# Patient Record
Sex: Female | Born: 1945
Health system: Southern US, Community
[De-identification: ages and names within clinical notes are randomized; demographics above are authoritative.]

## PROBLEM LIST (undated history)

## (undated) DIAGNOSIS — M79602 Pain in left arm: Secondary | ICD-10-CM

## (undated) DIAGNOSIS — H2511 Age-related nuclear cataract, right eye: Secondary | ICD-10-CM

## (undated) DIAGNOSIS — H2512 Age-related nuclear cataract, left eye: Secondary | ICD-10-CM

## (undated) DIAGNOSIS — E669 Obesity, unspecified: Secondary | ICD-10-CM

## (undated) DIAGNOSIS — K219 Gastro-esophageal reflux disease without esophagitis: Secondary | ICD-10-CM

## (undated) DIAGNOSIS — E7800 Pure hypercholesterolemia, unspecified: Secondary | ICD-10-CM

## (undated) DIAGNOSIS — I10 Essential (primary) hypertension: Secondary | ICD-10-CM

## (undated) DIAGNOSIS — H409 Unspecified glaucoma: Secondary | ICD-10-CM

## (undated) HISTORY — DX: Obesity, unspecified: E66.9

## (undated) HISTORY — DX: Unspecified glaucoma: H40.9

## (undated) HISTORY — PX: BREAST EXCISIONAL BIOPSY: SUR124

## (undated) HISTORY — PX: EYE SURGERY: SHX253

## (undated) HISTORY — PX: TONSILLECTOMY: SUR1361

## (undated) HISTORY — PX: ABDOMINAL HYSTERECTOMY: SHX81

---

## 1998-01-14 ENCOUNTER — Ambulatory Visit (HOSPITAL_COMMUNITY): Admission: RE | Admit: 1998-01-14 | Discharge: 1998-01-14 | Payer: Self-pay | Admitting: Endocrinology

## 1998-01-14 ENCOUNTER — Encounter: Payer: Self-pay | Admitting: Endocrinology

## 1998-02-13 ENCOUNTER — Ambulatory Visit (HOSPITAL_COMMUNITY): Admission: RE | Admit: 1998-02-13 | Discharge: 1998-02-13 | Payer: Self-pay | Admitting: Obstetrics and Gynecology

## 1998-02-13 ENCOUNTER — Encounter: Payer: Self-pay | Admitting: Obstetrics and Gynecology

## 1998-02-17 ENCOUNTER — Encounter: Payer: Self-pay | Admitting: Obstetrics and Gynecology

## 1998-02-17 ENCOUNTER — Ambulatory Visit (HOSPITAL_COMMUNITY): Admission: RE | Admit: 1998-02-17 | Discharge: 1998-02-17 | Payer: Self-pay | Admitting: Obstetrics and Gynecology

## 1999-01-13 ENCOUNTER — Encounter: Payer: Self-pay | Admitting: Endocrinology

## 1999-01-13 ENCOUNTER — Encounter: Payer: Self-pay | Admitting: Obstetrics and Gynecology

## 1999-01-13 ENCOUNTER — Ambulatory Visit (HOSPITAL_COMMUNITY): Admission: RE | Admit: 1999-01-13 | Discharge: 1999-01-13 | Payer: Self-pay | Admitting: Endocrinology

## 1999-01-13 ENCOUNTER — Ambulatory Visit (HOSPITAL_COMMUNITY): Admission: RE | Admit: 1999-01-13 | Discharge: 1999-01-13 | Payer: Self-pay | Admitting: Obstetrics and Gynecology

## 1999-02-15 ENCOUNTER — Encounter: Payer: Self-pay | Admitting: Obstetrics and Gynecology

## 1999-02-16 ENCOUNTER — Observation Stay (HOSPITAL_COMMUNITY): Admission: AD | Admit: 1999-02-16 | Discharge: 1999-02-17 | Payer: Self-pay | Admitting: Obstetrics and Gynecology

## 1999-02-16 ENCOUNTER — Encounter (INDEPENDENT_AMBULATORY_CARE_PROVIDER_SITE_OTHER): Payer: Self-pay | Admitting: Specialist

## 1999-02-17 ENCOUNTER — Encounter: Payer: Self-pay | Admitting: Obstetrics and Gynecology

## 1999-02-19 ENCOUNTER — Encounter: Payer: Self-pay | Admitting: Obstetrics and Gynecology

## 1999-02-19 ENCOUNTER — Emergency Department (HOSPITAL_COMMUNITY): Admission: EM | Admit: 1999-02-19 | Discharge: 1999-02-20 | Payer: Self-pay

## 2000-01-03 ENCOUNTER — Other Ambulatory Visit: Admission: RE | Admit: 2000-01-03 | Discharge: 2000-01-03 | Payer: Self-pay | Admitting: Obstetrics and Gynecology

## 2000-02-29 ENCOUNTER — Encounter: Payer: Self-pay | Admitting: Endocrinology

## 2000-02-29 ENCOUNTER — Ambulatory Visit (HOSPITAL_COMMUNITY): Admission: RE | Admit: 2000-02-29 | Discharge: 2000-02-29 | Payer: Self-pay | Admitting: Endocrinology

## 2000-10-07 ENCOUNTER — Emergency Department (HOSPITAL_COMMUNITY): Admission: EM | Admit: 2000-10-07 | Discharge: 2000-10-07 | Payer: Self-pay | Admitting: Emergency Medicine

## 2000-11-06 ENCOUNTER — Ambulatory Visit (HOSPITAL_COMMUNITY): Admission: RE | Admit: 2000-11-06 | Discharge: 2000-11-06 | Payer: Self-pay | Admitting: Endocrinology

## 2000-11-06 ENCOUNTER — Encounter: Payer: Self-pay | Admitting: Endocrinology

## 2001-03-07 ENCOUNTER — Other Ambulatory Visit: Admission: RE | Admit: 2001-03-07 | Discharge: 2001-03-07 | Payer: Self-pay | Admitting: Obstetrics and Gynecology

## 2001-04-10 ENCOUNTER — Ambulatory Visit (HOSPITAL_COMMUNITY): Admission: RE | Admit: 2001-04-10 | Discharge: 2001-04-10 | Payer: Self-pay | Admitting: Endocrinology

## 2001-04-10 ENCOUNTER — Encounter: Payer: Self-pay | Admitting: Endocrinology

## 2002-04-12 ENCOUNTER — Ambulatory Visit (HOSPITAL_COMMUNITY): Admission: RE | Admit: 2002-04-12 | Discharge: 2002-04-12 | Payer: Self-pay | Admitting: Obstetrics and Gynecology

## 2002-04-12 ENCOUNTER — Encounter: Payer: Self-pay | Admitting: Obstetrics and Gynecology

## 2002-05-14 ENCOUNTER — Other Ambulatory Visit: Admission: RE | Admit: 2002-05-14 | Discharge: 2002-05-14 | Payer: Self-pay | Admitting: Obstetrics and Gynecology

## 2002-08-06 ENCOUNTER — Encounter: Admission: RE | Admit: 2002-08-06 | Discharge: 2002-11-04 | Payer: Self-pay | Admitting: Endocrinology

## 2003-04-21 ENCOUNTER — Ambulatory Visit (HOSPITAL_COMMUNITY): Admission: RE | Admit: 2003-04-21 | Discharge: 2003-04-21 | Payer: Self-pay | Admitting: Obstetrics and Gynecology

## 2004-08-04 ENCOUNTER — Ambulatory Visit (HOSPITAL_COMMUNITY): Admission: RE | Admit: 2004-08-04 | Discharge: 2004-08-04 | Payer: Self-pay | Admitting: Endocrinology

## 2005-09-21 ENCOUNTER — Ambulatory Visit (HOSPITAL_COMMUNITY): Admission: RE | Admit: 2005-09-21 | Discharge: 2005-09-21 | Payer: Self-pay | Admitting: Endocrinology

## 2006-05-11 ENCOUNTER — Encounter (INDEPENDENT_AMBULATORY_CARE_PROVIDER_SITE_OTHER): Payer: Self-pay | Admitting: Specialist

## 2006-05-11 ENCOUNTER — Ambulatory Visit (HOSPITAL_COMMUNITY): Admission: RE | Admit: 2006-05-11 | Discharge: 2006-05-11 | Payer: Self-pay | Admitting: *Deleted

## 2006-05-11 ENCOUNTER — Encounter (INDEPENDENT_AMBULATORY_CARE_PROVIDER_SITE_OTHER): Payer: Self-pay | Admitting: *Deleted

## 2006-10-10 ENCOUNTER — Ambulatory Visit (HOSPITAL_COMMUNITY): Admission: RE | Admit: 2006-10-10 | Discharge: 2006-10-10 | Payer: Self-pay | Admitting: Endocrinology

## 2007-07-29 ENCOUNTER — Emergency Department (HOSPITAL_BASED_OUTPATIENT_CLINIC_OR_DEPARTMENT_OTHER): Admission: EM | Admit: 2007-07-29 | Discharge: 2007-07-29 | Payer: Self-pay | Admitting: Emergency Medicine

## 2007-10-16 ENCOUNTER — Ambulatory Visit (HOSPITAL_COMMUNITY): Admission: RE | Admit: 2007-10-16 | Discharge: 2007-10-16 | Payer: Self-pay | Admitting: Endocrinology

## 2008-05-08 ENCOUNTER — Ambulatory Visit (HOSPITAL_COMMUNITY): Admission: EM | Admit: 2008-05-08 | Discharge: 2008-05-09 | Payer: Self-pay | Admitting: Internal Medicine

## 2008-05-08 ENCOUNTER — Ambulatory Visit: Payer: Self-pay | Admitting: Cardiology

## 2008-05-08 ENCOUNTER — Ambulatory Visit: Payer: Self-pay | Admitting: Diagnostic Radiology

## 2008-05-08 ENCOUNTER — Encounter: Payer: Self-pay | Admitting: Emergency Medicine

## 2008-05-19 ENCOUNTER — Telehealth (INDEPENDENT_AMBULATORY_CARE_PROVIDER_SITE_OTHER): Payer: Self-pay | Admitting: *Deleted

## 2008-05-20 ENCOUNTER — Encounter: Payer: Self-pay | Admitting: Cardiology

## 2008-05-20 ENCOUNTER — Ambulatory Visit: Payer: Self-pay

## 2008-05-26 DIAGNOSIS — E118 Type 2 diabetes mellitus with unspecified complications: Secondary | ICD-10-CM | POA: Insufficient documentation

## 2008-05-26 DIAGNOSIS — E663 Overweight: Secondary | ICD-10-CM | POA: Insufficient documentation

## 2008-05-26 DIAGNOSIS — E119 Type 2 diabetes mellitus without complications: Secondary | ICD-10-CM | POA: Insufficient documentation

## 2008-05-26 DIAGNOSIS — E7800 Pure hypercholesterolemia, unspecified: Secondary | ICD-10-CM | POA: Insufficient documentation

## 2008-05-26 DIAGNOSIS — M1990 Unspecified osteoarthritis, unspecified site: Secondary | ICD-10-CM | POA: Insufficient documentation

## 2008-05-26 DIAGNOSIS — I10 Essential (primary) hypertension: Secondary | ICD-10-CM | POA: Insufficient documentation

## 2008-06-16 ENCOUNTER — Telehealth: Payer: Self-pay | Admitting: Cardiology

## 2008-11-12 ENCOUNTER — Ambulatory Visit (HOSPITAL_COMMUNITY): Admission: RE | Admit: 2008-11-12 | Discharge: 2008-11-12 | Payer: Self-pay | Admitting: Endocrinology

## 2008-12-16 ENCOUNTER — Ambulatory Visit: Payer: Self-pay | Admitting: Gastroenterology

## 2009-07-04 IMAGING — CT CT ABDOMEN W/O CM
2 of 3 series · 13 of 36 positions shown, 18 images · non-contrast
Comparison: No comparison exams available.

CT ABDOMEN

CLINICAL DATA: Right flank pain radiating to right lower quadrant
with nausea.  Status post total hysterectomy, cholecystectomy and
appendectomy.

CT ABDOMEN AND PELVIS WITHOUT CONTRAST
TECHNIQUE: Multidetector CT imaging of the abdomen and pelvis was
performed following the standard
protocol without intravenous contrast.

[Series 2: renal stone > 200 lbs 5.0 b31f · axial · 0.76mm/px · z∈[+628,+1044]mm · 12 of 95 slices shown, 16 images]
[im 8/95  soft-tissue]
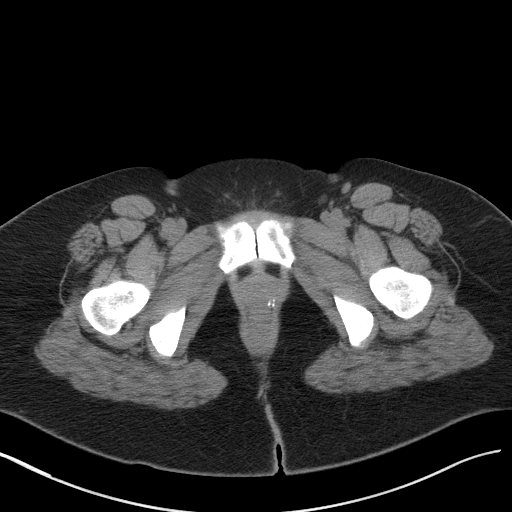
[im 8/95  bone]
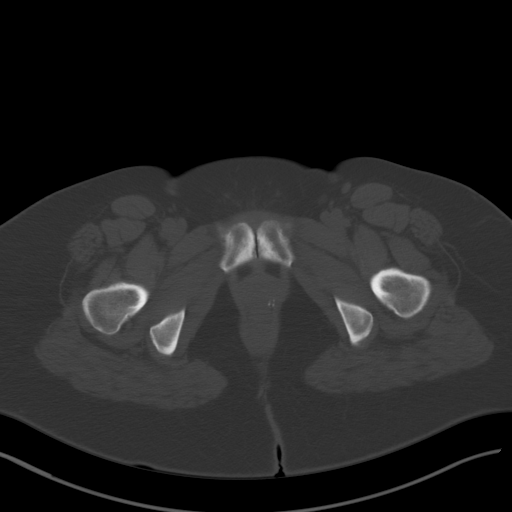
[im 16/95  soft-tissue]
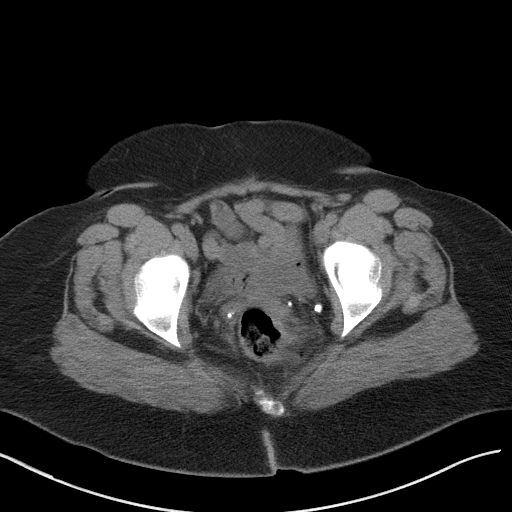
[im 24/95  soft-tissue]
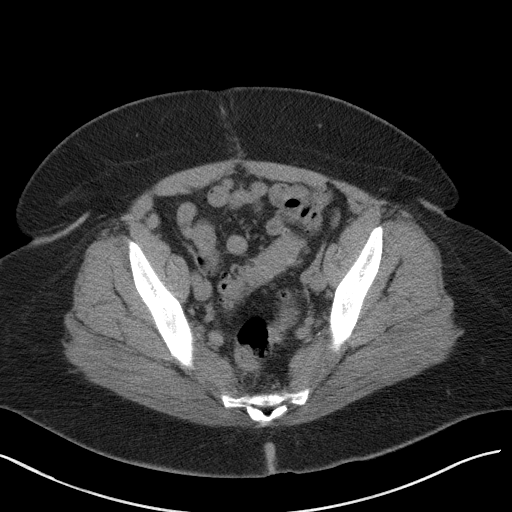
[im 36/95  soft-tissue]
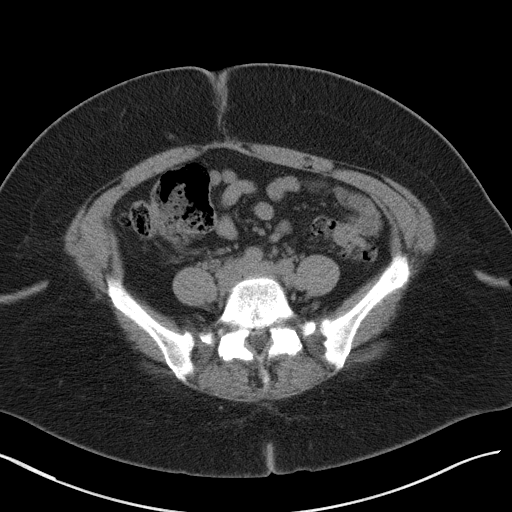
[im 44/95  soft-tissue]
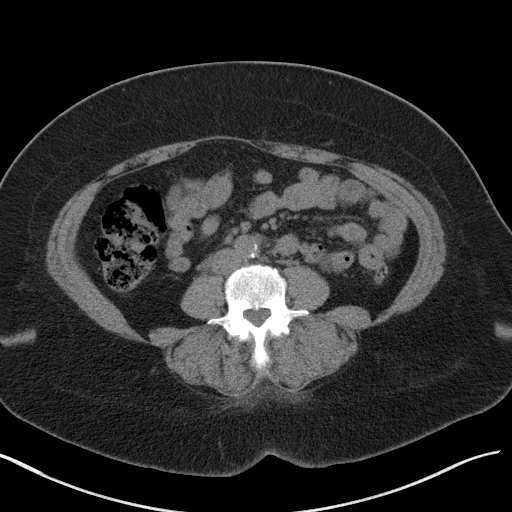
[im 51/95  soft-tissue]
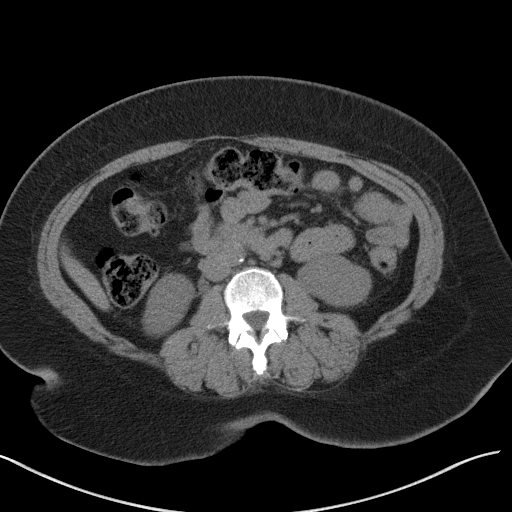
[im 59/95  soft-tissue]
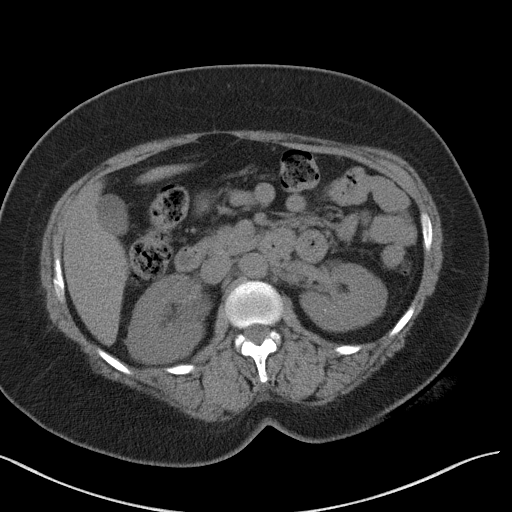
[im 71/95  soft-tissue]
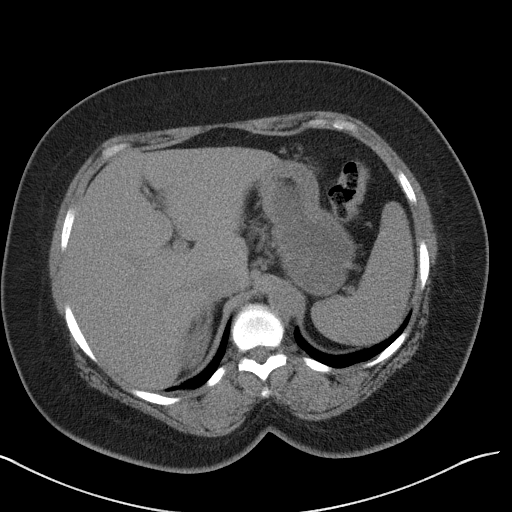
[im 79/95  soft-tissue]
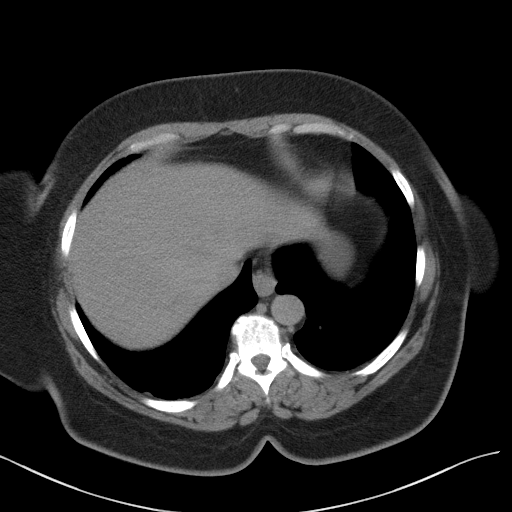
[im 79/95  lung]
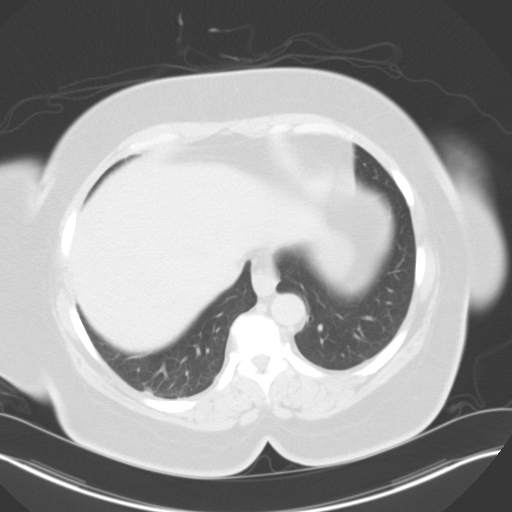
[im 79/95  bone]
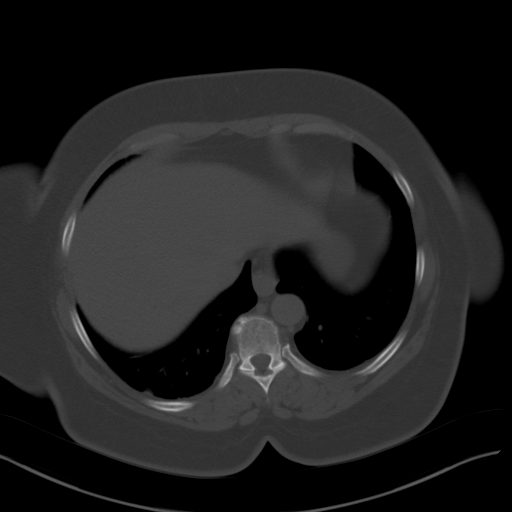
[im 83/95  lung]
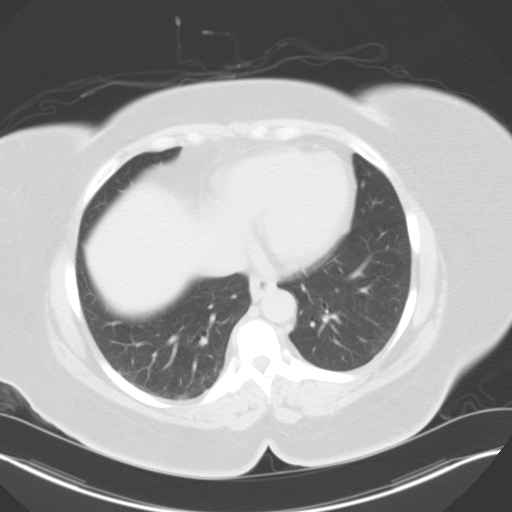
[im 87/95  soft-tissue]
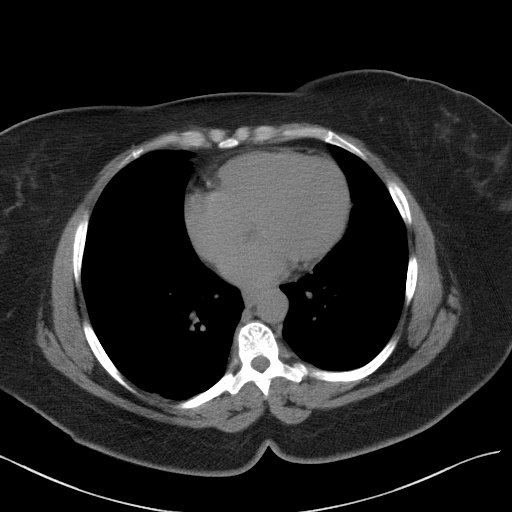
[im 87/95  lung]
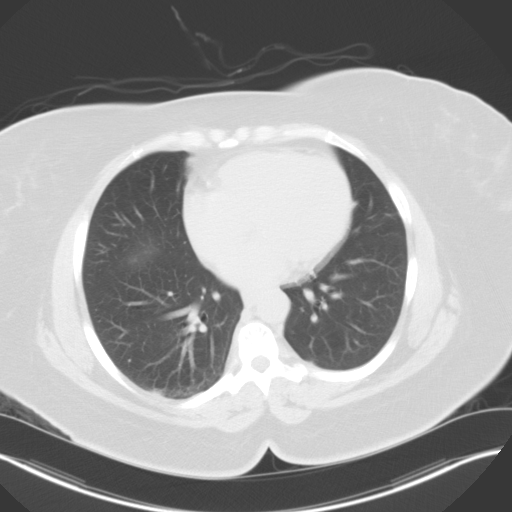
[im 91/95  lung]
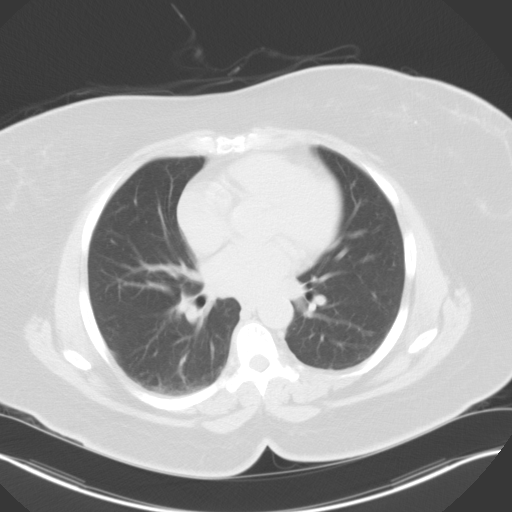

[Series 4: renal stone 2.0 sagittal · sagittal · 0.63mm/px · 1 of 152 slices shown, 2 images]
[im 51/152  soft-tissue]
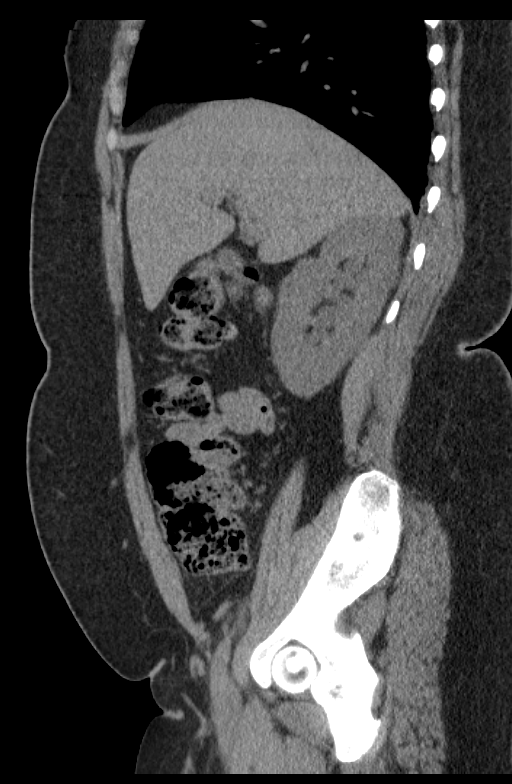
[im 51/152  bone]
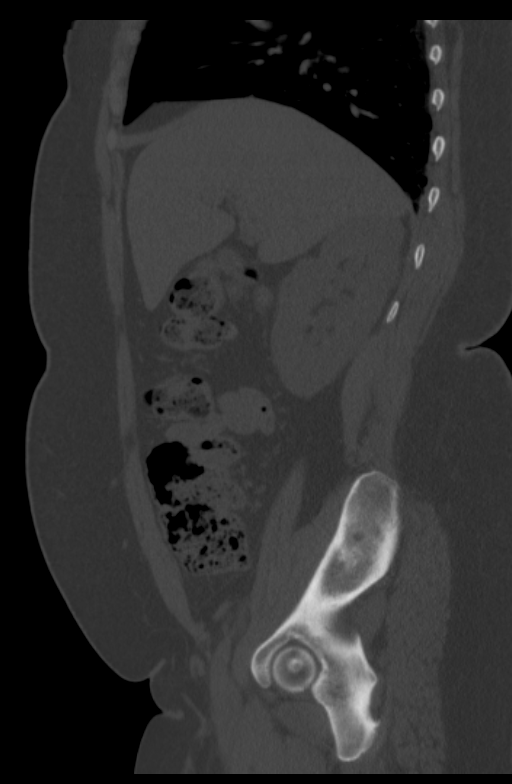

[13 of 36 positions shown; findings below may reference images not displayed]

FINDINGS: Minimal atelectatic changes lung bases.  Prominent
coronary artery calcifications.  Fullness of the right renal
collecting system.  Please see below.  Unenhanced imaging of the
liver, spleen, pancreas, adrenal glands and left kidney
unremarkable.  No calcified gallstones.  No gross bowel
abnormality.  Evaluation limited secondary lack of contrast.
IMPRESSION: Mildly dilated right renal collecting system.  Please see above.

Prominent coronary artery calcifications.

Basilar subsegmental atelectasis.

CT PELVIS
FINDINGS: Although difficult to confirm with certainty given the
fact that there are multiple surrounding phleboliths, findings are
suggestive of a 2 mm distal right distal ureteral partially
obstructing calculus located 4 cm proximal to the ureteral vesicle
junction.

Despite the history of appendectomy, the  appendix is in place
without CT evidence of surrounding inflammation.  Significant
sigmoid diverticulosis without evidence of surrounding
inflammation.  Prominent degenerative changes L5-S1.
Atherosclerotic type changes lower abdominal aorta and common iliac
arteries with mild ectasia but without aneurysmal dilatation.
Small number of normal sized retroperitoneal and pelvic lymph
nodes.
IMPRESSION: Suggestion of 2 mm distal right ureteral partially obstructing
calculus as discussed above.

Sigmoid diverticulosis without evidence of inflammation.

## 2009-12-16 ENCOUNTER — Ambulatory Visit (HOSPITAL_COMMUNITY): Admission: RE | Admit: 2009-12-16 | Discharge: 2009-12-16 | Payer: Self-pay | Admitting: Endocrinology

## 2010-03-04 NOTE — Progress Notes (Signed)
Summary: Nuc Pre-Procedure  Phone Note Outgoing Call   Call placed by: Milana Na, EMT-P,  May 19, 2008 2:58 PM Summary of Call: Reviewed information on Myoview Information Sheet (see scanned document for further details).  Spoke with the patient       Nuclear Med Background Indications for Stress Test: Evaluation for Ischemia, Post Hospital     Symptoms: Chest Pain, Chest Tightness  Symptoms Comments: Radiates to the neck and (R) arm    Nuclear Pre-Procedure Cardiac Risk Factors: Hypertension, Lipids, NIDDM, Obesity

## 2010-03-04 NOTE — Procedures (Signed)
Summary: Colon   Colonoscopy  Procedure date:  05/11/2006  Findings:      Location:  [facility].   NAME:  [name], [name]              ACCOUNT NO.:  [id number]   MEDICAL RECORD NO.:  [id number]          PATIENT TYPE:  AMB   LOCATION:  ENDO                         FACILITY:  [facility]   PHYSICIAN:  Georgiana Spinner, M.D.    DATE OF BIRTH:  04-29-1945   DATE OF PROCEDURE:  DATE OF DISCHARGE:                               OPERATIVE REPORT   PROCEDURE:  Colonoscopy.   INDICATION:  Colon cancer screening.   ANESTHESIA:  Fentanyl 75 mcg, Versed 2.5 mg.   DESCRIPTION OF PROCEDURE:  With the patient mildly sedated in the left  lateral decubitus position, the Pentax videoscopic colonoscope was  inserted in the rectum and passed under direct vision through a  diverticular filled sigmoid colon to reach the cecum.  This identified  by ileocecal valve and base of cecum, both of which were photographed.  From this point, the colonoscope was slowly withdrawn, taking  circumferential views of the colonic mucosa, stopping to photograph the  diverticula seen in the sigmoid colon until we reached the rectum, which  appeared normal on direct and retroflexed views.  The endoscope was  straightened and withdrawn.  The patient's vital signs and pulse  oximetry remained stable.  The patient tolerated the procedure well with  no apparent complications.   FINDINGS:  Diverticulosis of sigmoid colon, otherwise, unremarkable  exam.   PLAN:  Consider repeat examination and biopsy 10 years.           ______________________________  Georgiana Spinner, M.D.     GMO/MEDQ  D:  05/11/2006  T:  05/11/2006  Job:  [id number]

## 2010-03-04 NOTE — Assessment & Plan Note (Signed)
History of Present Illness Visit Type: Initial Visit Primary GI MD: Rob Bunting MD Primary Provider: Adela Lank, MD Chief Complaint: Rectal bleeding 11/19/08 History of Present Illness:     very pleasant 65 yo woman who had painless rectal bleeding last month, filled the toilett bowell, this occured 2-3 times in AM.  She went to PCP, they did .   was told to lay off solid food for 2 days.  The bleeding tapered off gradually over about 24 hours.  She stayed on fluids for 2-3 days.  CBC done in pcp's office showed Hb 13.5.  She has had no bleeding since then.  She had never had bleed before this either.    She has pyrosis periodically and takes nexium about twice a week and on this regimine she feels very well.  Can had pyrosis, sometimes chest pains.  never sob with this.  I reviewed colonoscopy and EGD reports from Dr. Virginia Rochester procedures in 05/2006.  No polyps were found.  + sigmoid diverticulsois, +h pylori gastritis.           Current Medications (verified): 1)  Aspir-Low 81 Mg Tbec (Aspirin) .Marland Kitchen.. 1 By Mouth Once Daily 2)  Exforge 10-320 Mg Tabs (Amlodipine Besylate-Valsartan) .Marland Kitchen.. 1 By Mouth Once Daily 3)  Glucophage 500 Mg Tabs (Metformin Hcl) .Marland Kitchen.. 1 By Mouth Two Times A Day 4)  Lipitor 10 Mg Tabs (Atorvastatin Calcium) .Marland Kitchen.. 1 By Mouth At Bedtime 5)  Vitamin E 400 Unit Caps (Vitamin E) .Marland Kitchen.. 1 By Mouth Once Daily 6)  Nexium 40 Mg Cpdr (Esomeprazole Magnesium) .... As Needed 7)  Travatan Z 0.004 % Soln (Travoprost) .... At Bedtime 8)  Combigan 0.2-0.5 % Soln (Brimonidine Tartrate-Timolol) .... Two Times A Day Every 12 Hours 9)  Meloxicam 15 Mg Tabs (Meloxicam) .... Twice Weekly 10)  Tylenol 325 Mg Tabs (Acetaminophen) .... As Needed  Allergies (verified): 1)  ! Sulfa 2)  ! Vicodin  Past History:  Past Medical History: DEGENERATIVE JOINT DISEASE (ICD-715.90) DM (ICD-250.00) HYPERTENSION, UNSPECIFIED (ICD-401.9) OVERWEIGHT/OBESITY (ICD-278.02) HYPERCHOLESTEROLEMIA  IIA  (ICD-272.0) diverticulosis arthritis glaucoma kidney stones    Past Surgical History: Appendectomy cholecystectomy hysterectomy  oophorectomy  Family History: no colon cancer  Social History: Married  Tobacco Use - No.  Alcohol Use - no Drug Use - no retired 4 children  Review of Systems       Pertinent positive and negative review of systems were noted in the above HPI and GI specific review of systems.  All other review of systems was otherwise negative.   Vital Signs:  Patient profile:   65 year old female Height:      63 inches Weight:      249.50 pounds BMI:     44.36 Pulse rate:   60 / minute Pulse rhythm:   regular BP sitting:   126 / 80  (left arm) Cuff size:   large  Vitals Entered By: June McMurray CMA Duncan Dull) (December 16, 2008 8:33 AM)  Physical Exam  Additional Exam:  Constitutional:  Obese otherwise generally well appearing Psychiatric: alert and oriented times 3 Eyes: extraocular movements intact Mouth: oropharynx moist, no lesions Neck: supple, no lymphadenopathy Cardiovascular: heart regular rate and rythm Lungs: CTA bilaterally Abdomen: soft, non-tender, non-distended, no obvious ascites, no peritoneal signs, normal bowel sounds Extremities: no lower extremity edema bilaterally Skin: no lesions on visible extremities anorectal examination with female assistant in room small external anal hemorrhoids, no anal fissures, no rectal masses, stool was brown and Hemoccult  negative    Impression & Recommendations:  Problem # 1:  painless rectal bleeding this was likely diverticular in origin. She had sigmoid diverticulosis noted on 2008 colonoscopy by Dr. Greggory Stallion or. Her anorectal examination today showed no microscopic blood in her stool and no masses in her rectum. I think it is fine to simply observe her clinically. She knows to call here if she has further rectal bleeding. We discussed the nature of diverticulosis and the fact that with one  episode of symptoms she will probably not have further episodes however if she does start to have further diverticular complications they may become more recurrent. She knows to call here she has further problems. We put her in our reminder system for routine colonoscopy at 10 year interval.  Patient Instructions: 1)  recall colonoscopy 05/2016. 2)  please call Dr. Christella Hartigan office with any further rectal bleeding, GI issues. 3)  continue to take nexium as needed. 4)  A copy of this information will be sent to Dr Juleen China. 5)  The medication list was reviewed and reconciled.  All changed / newly prescribed medications were explained.  A complete medication list was provided to the patient / caregiver.

## 2010-03-04 NOTE — Procedures (Signed)
Summary: EGD   EGD  Procedure date:  05/11/2006  Findings:      Location: [facility]   NAME:  [name], [name]              ACCOUNT NO.:  [id number]   MEDICAL RECORD NO.:  [id number]          PATIENT TYPE:  AMB   LOCATION:  ENDO                         FACILITY:  [facility]   PHYSICIAN:  Georgiana Spinner, M.D.    DATE OF BIRTH:  1945-08-12   DATE OF PROCEDURE:  DATE OF DISCHARGE:                               OPERATIVE REPORT   PROCEDURE:  Upper endoscopy.   INDICATION:  Gastroesophageal reflux disease.   ANESTHESIA:  Fentanyl 50 mcg, Versed 7.5 mg.   DESCRIPTION OF PROCEDURE:  With the patient mildly sedated in the left  lateral decubitus position.  The Pentax videoscopic endoscope was  inserted in the mouth, passed under direct vision through the esophagus  which appeared normal.  There was no evidence of Barrett's esophagus.  We entered into the stomach, fundus, body, antrum, duodenal bulb, second  portion of the duodenum were visualized.  From this point, the endoscope  was slowly withdrawn, taking circumferential views of the duodenal  mucosa until the endoscope had been pulled back into the stomach, placed  in retroflexion to the stomach from below.  The endoscope was  straightened and withdrawn, taking circumferential views in the  endogastric and esophageal mucosa, stopping in the fundus of the stomach  where snake skinning of the gastric mucosa was noted and it was  photographed and it was biopsied.  The patient's vital signs and pulse  oximetry remained stable.  The patient tolerated the procedure well with  no apparent complications.   FINDINGS:  Snake skinning of the gastric mucosa, biopsied.   PLAN:  To await biopsy report.  The patient will call me for results and  follow up with me as an outpatient.  Proceed to colonoscopy as planned.           ______________________________  Georgiana Spinner, M.D.     GMO/MEDQ  D:  05/11/2006  T:  05/11/2006  Job:   [id number]

## 2010-03-04 NOTE — Progress Notes (Signed)
Summary: Myoview Results  Phone Note Call from Patient   Caller: Patient Reason for Call: Talk to Nurse Summary of Call: pt wants to know of the test results done last april 20,pls call pt cell [phone] Initial call taken by: Sydell Axon,  Jun 16, 2008 12:28 PM  Follow-up for Phone Call        Phone Call Completed. RN s/w Pt made aware of test results.   Follow-up by: Bernita Raisin, RN, BSN,  Jun 16, 2008 2:15 PM

## 2010-03-04 NOTE — Miscellaneous (Signed)
Summary: recall colon  Clinical Lists Changes  Observations: Added new observation of COLONNXTDUE: 05/2016 (12/16/2008 8:56)

## 2010-05-12 LAB — GLUCOSE, CAPILLARY
Glucose-Capillary: 101 mg/dL — ABNORMAL HIGH (ref 70–99)
Glucose-Capillary: 131 mg/dL — ABNORMAL HIGH (ref 70–99)
Glucose-Capillary: 97 mg/dL (ref 70–99)
Glucose-Capillary: 99 mg/dL (ref 70–99)

## 2010-05-12 LAB — CARDIAC PANEL(CRET KIN+CKTOT+MB+TROPI)
CK, MB: 1.1 ng/mL (ref 0.3–4.0)
CK, MB: 1.2 ng/mL (ref 0.3–4.0)
Relative Index: INVALID (ref 0.0–2.5)
Relative Index: INVALID (ref 0.0–2.5)
Total CK: 88 U/L (ref 7–177)
Total CK: 90 U/L (ref 7–177)
Troponin I: 0.03 ng/mL (ref 0.00–0.06)
Troponin I: 0.05 ng/mL (ref 0.00–0.06)

## 2010-05-12 LAB — DIFFERENTIAL
Basophils Absolute: 0.1 10*3/uL (ref 0.0–0.1)
Basophils Relative: 1 % (ref 0–1)
Eosinophils Absolute: 0.4 10*3/uL (ref 0.0–0.7)
Eosinophils Relative: 6 % — ABNORMAL HIGH (ref 0–5)
Lymphocytes Relative: 33 % (ref 12–46)
Lymphs Abs: 2.1 10*3/uL (ref 0.7–4.0)
Monocytes Absolute: 0.9 10*3/uL (ref 0.1–1.0)
Monocytes Relative: 14 % — ABNORMAL HIGH (ref 3–12)
Neutro Abs: 3 10*3/uL (ref 1.7–7.7)
Neutrophils Relative %: 46 % (ref 43–77)

## 2010-05-12 LAB — BASIC METABOLIC PANEL
BUN: 21 mg/dL (ref 6–23)
CO2: 24 mEq/L (ref 19–32)
Calcium: 10.4 mg/dL (ref 8.4–10.5)
Chloride: 106 mEq/L (ref 96–112)
Creatinine, Ser: 1 mg/dL (ref 0.4–1.2)
GFR calc Af Amer: >60 mL/min (ref >60)
GFR calc non Af Amer: 57 mL/min — ABNORMAL LOW (ref >60)
Glucose, Bld: 132 mg/dL — ABNORMAL HIGH (ref 70–99)
Potassium: 4.5 mEq/L (ref 3.5–5.1)
Sodium: 142 mEq/L (ref 135–145)

## 2010-05-12 LAB — CBC
HCT: 37.3 % (ref 36.0–46.0)
Hemoglobin: 12.7 g/dL (ref 12.0–15.0)
MCHC: 34.2 g/dL (ref 30.0–36.0)
MCV: 86.9 fL (ref 78.0–100.0)
Platelets: 247 10*3/uL (ref 150–400)
RBC: 4.29 MIL/uL (ref 3.87–5.11)
RDW: 13.2 % (ref 11.5–15.5)
WBC: 6.5 10*3/uL (ref 4.0–10.5)

## 2010-05-12 LAB — PROTIME-INR
INR: 0.9 (ref 0.00–1.49)
Prothrombin Time: 12.7 seconds (ref 11.6–15.2)

## 2010-05-12 LAB — POCT CARDIAC MARKERS
CKMB, poc: 1.2 ng/mL (ref 1.0–8.0)
Myoglobin, poc: 72.3 ng/mL (ref 12–200)
Troponin i, poc: <0.05 ng/mL (ref 0.00–0.09)

## 2010-05-12 LAB — HEMOGLOBIN A1C
Hgb A1c MFr Bld: 6.5 % — ABNORMAL HIGH (ref 4.6–6.1)
Mean Plasma Glucose: 140 mg/dL

## 2010-05-12 LAB — APTT: aPTT: 34 seconds (ref 24–37)

## 2010-06-15 NOTE — Consult Note (Signed)
Caitlin, [name]              ACCOUNT NO.:  [id number]   MEDICAL RECORD NO.:  [id number]          PATIENT TYPE:  INP   LOCATION:  1401                         FACILITY:  [facility]   PHYSICIAN:  Jesse Sans. Wall, MD, FACCDATE OF BIRTH:  [birth year]   DATE OF CONSULTATION:  05/09/2008  DATE OF DISCHARGE:                                 CONSULTATION   REASON FOR CONSULTATION:  I was asked by Dr. Ellin Saba of [facility]  hospitalist service to consult on [name] with chest discomfort.   HISTORY OF PRESENT ILLNESS:  The patient is a 65 year old [place]  American female who was well until the evening of May 08, 2008.  She  developed chest tightness above her left breast without any radiation.  She noticed when she moved her arm it went up into her neck.  It was a  sharp pain at that point.  She had no associated nausea, vomiting,  diaphoresis.   She exercises on a regular basis of the Y.  She has had no symptoms of  angina or ischemic equivalents.   She does have multiple cardiac risk factors including type 2 diabetes,  hyperlipidemia, hypertension.   She is ruled out for myocardial infarction here.  Chest x-ray showed no  acute cardiopulmonary disease.   PAST MEDICAL HISTORY:  Significant for the above as well as DJD and  obesity.   ALLERGIES:  SULFA AND VICODIN.   PAST SURGICAL HISTORY:  Appendectomy, cholecystectomy, hysterectomy,  oophorectomy.   SOCIAL HISTORY:  She is married.  She does not smoke or drink.   FAMILY HISTORY:  Noncontributory.   CURRENT MEDICATIONS:  1. Aspirin 81 mg a day.  2. Exforge 10/320 daily.  3. Glucophage 500 mg p.o. b.i.d.  4. Glucotrol 5 mg per day.  5. Lipitor 10 mg q.h.s.  6. Vitamin E 400 units a day.  7. Nexium 40 mg p.r.n.  8. Travatan eye drops at bed time.  9. Mobic 50 mg p.o. p.r.n.   REVIEW OF SYSTEMS:  All systems questioned and other than HPI are all  negative.   PHYSICAL EXAMINATION:  VITAL SIGNS:  Blood pressure is 130/84,  pulse is  85 and she is in sinus rhythm.  EKG is normal.  Temperature is 98.2,  respirations are 18 and unlabored.  Her sats are 100% on 2 liters.  Her  blood sugars 101.  She is 63 inches tall, weighs 107.2 kg.  She is alert  and oriented x3, very pleasant.  Skin is warm and dry.  Otherwise,  normal.  HEENT:  Normal.  NECK:  Supple.  Carotid upstrokes equal bilaterally without bruits.  No  JVD.  Thyroid is not enlarged.  Trachea is midline.  LUNGS:  Clear to auscultation and percussion.  HEART:  Reveals a regular rate and rhythm.  No murmur or gallop.  There  is some mild tenderness to palpation around the left clavicle.  Pulses  in the upper extremities are intact.  No edema.  Abdominal exam is soft,  good bowel sounds.  Organomegaly was difficult to assess with her size.  EXTREMITIES:  No  cyanosis, clubbing or edema.  Pulses were brisk.  NEUROLOGICAL:  Exam is intact.   X-RAYS:  Laboratory data all reviewed.   ASSESSMENT:  1. Musculoskeletal chest pain.  2. Multiple cardiac risk factors including coronary artery disease      equivalency with diabetes.   PLAN:  1. Okay to discharge home.  2. Continue current medications including aspirin.  3. Outpatient Myoview with [facility] to be arranged next      week.  We will call her and set up the appointment.   Thank you much for the consultation.      Thomas C. Daleen Squibb, MD, [facility]  Electronically Signed     TCW/MEDQ  D:  05/09/2008  T:  05/09/2008  Job:  [id number]

## 2010-06-15 NOTE — H&P (Signed)
[name], [name]              ACCOUNT NO.:  [id number]   MEDICAL RECORD NO.:  [id number]          PATIENT TYPE:  INP   LOCATION:  1401                         FACILITY:  [facility]   PHYSICIAN:  Pedro Earls, MD     DATE OF BIRTH:  [birth year]   DATE OF ADMISSION:  05/08/2008  DATE OF DISCHARGE:                              HISTORY & PHYSICAL   PRIMARY CARE PHYSICIAN:  Brooke Bonito, M.D.   CHIEF COMPLAINT:  Chest pain.   HISTORY OF PRESENT ILLNESS:  This is a 65 year old African American  female patient with a past medical history significant for diabetes,  hypertension, high cholesterol who was apparently asymptomatic until 6  p.m. this evening when the patient started having some chest discomfort.  The patient stated the chest pain started in the left upper side of her  chest around 6 o'clock, remained constant, and had radiated to the left  side of her neck and also to the back.  The patient denies any nausea,  any vomiting, any fever or chills, shortness of breath, light-  headedness, or dizziness.  The chest pain was dull achiness, was 7/10 on  a scale of 1/10 when 10 is the worst pain.  There were no alleviating or  aggravating factors.   REVIEW OF SYSTEMS:  As above.  The rest of the review of systems were  negative.   PAST MEDICAL HISTORY:  1. Hypertension.  2. Diabetes.  3. DJD.  4. Hypercholesterolemia.  5. Obesity.   PAST SURGICAL HISTORY:  1. Appendectomy.  2. Cholecystectomy.  3. Hysterectomy.  4. Oophorectomy.   SOCIAL HISTORY:  Negative for smoking, alcohol, IV drug abuse.   FAMILY HISTORY:  Noncontributory.   ALLERGIES:  1. SULFA.  2. VICODIN.   MEDICATIONS:  1. Aspirin 81 daily.  2. Exforge one tab 10/320 daily.  3. Glucophage 500 b.i.d.  4. Glucotrol 5 mg daily.  5. Lipitor 10 mg at bedtime.  6. Vitamin E 400 international units daily.  7. Nexium 40 mg p.o. p.r.n.  8. Travatan eye drops each at bedtime.  9. Sustain one drop each eye at  bedtime.  10.Mobic 50 mg p.o. p.r.n.   PHYSICAL EXAMINATION:  VITAL SIGNS:  Temperature 98.9, respirations 15-  20, pulse 90-105, blood pressure 144-170/70-80, pulse ox 100%.  GENERAL:  The patient is awake, alert, oriented x3, does not appear to  be in acute distress.  HEENT:  Pupils equal, round, reactive to light.  No icterus, no pallor.  Extraocular movements intact.  Oral mucosa is dry.  NECK:  Supple.  No JVD.  No lymphadenopathy.  CARDIOVASCULAR:  S1/S2, regular, no murmurs, heaves or gallops.  LUNGS:  Clear.  ABDOMEN:  Soft, nontender.  Bowel sounds positive.  No  hepatosplenomegaly.  EXTREMITIES:  No clubbing, cyanosis, or edema.  CENTRAL NERVOUS SYSTEM:  Sensory and motor grossly intact.  Cranial  nerves II-XII intact.  SKIN:  No rashes.  MUSCULOSKELETAL:  Unremarkable.   LABORATORY DATA:  The patient's EKG showed normal sinus rhythm, no acute  ST/T-wave changes were seen.   Chest x-ray was negative for infiltrate.  Troponin was negative, less than 0.05.  The patient's blood sugar was  322.  CBC revealed normal hemoglobin and hematocrit, normal white count.   IMPRESSION:  1. Chest pain, rule out for myocardial infarction.  2. Diabetes.  3. Hypertension.  4. Hypercholesterolemia.  5. Glaucoma.  6. Gastroesophageal reflux disease.  7. Degenerative joint disease.  8. Obesity.   PLAN:  Will admit to telemetry 23-hour observation.  Cycle cardiac  enzymes.  Stress test in the a.m. to be read by [facility].  Continue insulin sliding scale.  Continue the rest of the medications.      Pedro Earls, MD  Electronically Signed     NS/MEDQ  D:  05/08/2008  T:  05/09/2008  Job:  [id number]   cc:   Brooke Bonito, M.D.  Fax: [phone]

## 2010-06-18 NOTE — Op Note (Signed)
NAME:  [name], [name]              ACCOUNT NO.:  [id number]   MEDICAL RECORD NO.:  [id number]          PATIENT TYPE:  AMB   LOCATION:  ENDO                         FACILITY:  [facility]   PHYSICIAN:  Georgiana Spinner, M.D.    DATE OF BIRTH:  Apr 09, 1945   DATE OF PROCEDURE:  DATE OF DISCHARGE:                               OPERATIVE REPORT   PROCEDURE:  Upper endoscopy.   INDICATION:  Gastroesophageal reflux disease.   ANESTHESIA:  Fentanyl 50 mcg, Versed 7.5 mg.   DESCRIPTION OF PROCEDURE:  With the patient mildly sedated in the left  lateral decubitus position.  The Pentax videoscopic endoscope was  inserted in the mouth, passed under direct vision through the esophagus  which appeared normal.  There was no evidence of Barrett's esophagus.  We entered into the stomach, fundus, body, antrum, duodenal bulb, second  portion of the duodenum were visualized.  From this point, the endoscope  was slowly withdrawn, taking circumferential views of the duodenal  mucosa until the endoscope had been pulled back into the stomach, placed  in retroflexion to the stomach from below.  The endoscope was  straightened and withdrawn, taking circumferential views in the  endogastric and esophageal mucosa, stopping in the fundus of the stomach  where snake skinning of the gastric mucosa was noted and it was  photographed and it was biopsied.  The patient's vital signs and pulse  oximetry remained stable.  The patient tolerated the procedure well with  no apparent complications.   FINDINGS:  Snake skinning of the gastric mucosa, biopsied.   PLAN:  To await biopsy report.  The patient will call me for results and  follow up with me as an outpatient.  Proceed to colonoscopy as planned.           ______________________________  Georgiana Spinner, M.D.     GMO/MEDQ  D:  05/11/2006  T:  05/11/2006  Job:  [id number]

## 2010-06-18 NOTE — Op Note (Signed)
NAME:  [name], [name]              ACCOUNT NO.:  [id number]   MEDICAL RECORD NO.:  [id number]          PATIENT TYPE:  AMB   LOCATION:  ENDO                         FACILITY:  [facility]   PHYSICIAN:  Georgiana Spinner, M.D.    DATE OF BIRTH:  [birth year]   DATE OF PROCEDURE:  DATE OF DISCHARGE:                               OPERATIVE REPORT   PROCEDURE:  Colonoscopy.   INDICATION:  Colon cancer screening.   ANESTHESIA:  Fentanyl 75 mcg, Versed 2.5 mg.   DESCRIPTION OF PROCEDURE:  With the patient mildly sedated in the left  lateral decubitus position, the Pentax videoscopic colonoscope was  inserted in the rectum and passed under direct vision through a  diverticular filled sigmoid colon to reach the cecum.  This identified  by ileocecal valve and base of cecum, both of which were photographed.  From this point, the colonoscope was slowly withdrawn, taking  circumferential views of the colonic mucosa, stopping to photograph the  diverticula seen in the sigmoid colon until we reached the rectum, which  appeared normal on direct and retroflexed views.  The endoscope was  straightened and withdrawn.  The patient's vital signs and pulse  oximetry remained stable.  The patient tolerated the procedure well with  no apparent complications.   FINDINGS:  Diverticulosis of sigmoid colon, otherwise, unremarkable  exam.   PLAN:  Consider repeat examination and biopsy 10 years.           ______________________________  Georgiana Spinner, M.D.     GMO/MEDQ  D:  05/11/2006  T:  05/11/2006  Job:  [id number]

## 2010-10-03 ENCOUNTER — Encounter: Payer: Self-pay | Admitting: *Deleted

## 2010-10-03 ENCOUNTER — Emergency Department (HOSPITAL_BASED_OUTPATIENT_CLINIC_OR_DEPARTMENT_OTHER)
Admission: EM | Admit: 2010-10-03 | Discharge: 2010-10-03 | Disposition: A | Discharge status: Home / Self Care | Payer: No Typology Code available for payment source | Attending: Emergency Medicine | Admitting: Emergency Medicine

## 2010-10-03 DIAGNOSIS — E119 Type 2 diabetes mellitus without complications: Secondary | ICD-10-CM | POA: Insufficient documentation

## 2010-10-03 DIAGNOSIS — L089 Local infection of the skin and subcutaneous tissue, unspecified: Secondary | ICD-10-CM

## 2010-10-03 DIAGNOSIS — E7800 Pure hypercholesterolemia, unspecified: Secondary | ICD-10-CM | POA: Insufficient documentation

## 2010-10-03 DIAGNOSIS — I10 Essential (primary) hypertension: Secondary | ICD-10-CM | POA: Insufficient documentation

## 2010-10-03 DIAGNOSIS — K219 Gastro-esophageal reflux disease without esophagitis: Secondary | ICD-10-CM | POA: Insufficient documentation

## 2010-10-03 HISTORY — DX: Gastro-esophageal reflux disease without esophagitis: K21.9

## 2010-10-03 HISTORY — DX: Essential (primary) hypertension: I10

## 2010-10-03 HISTORY — DX: Pure hypercholesterolemia, unspecified: E78.00

## 2010-10-03 MED ORDER — CEPHALEXIN 500 MG PO CAPS: 500.0000 mg | ORAL_CAPSULE | Freq: Four times a day (QID) | ORAL | Status: AC

## 2010-10-03 NOTE — ED Notes (Signed)
Pt states that she had a growth removed on Aug 16th. Over past few days has noticed increased drainage, redness and swelling.

## 2010-10-03 NOTE — ED Provider Notes (Signed)
History     CSN: [id number] Arrival date & time: 10/03/2010  7:25 PM  Chief Complaint  Patient presents with  . Wound Infection   HPI Comments: Pt states that she was not having any problem with the area until the last couple of days and now she is having some yellowish drainage to the area and the area is more sore  Patient is a 65 y.o. female presenting with wound check. The history is provided by the patient. No language interpreter was used.  Wound Check  Treated in ED: pt has a growth removed by pcp on August 16. Treatments since wound repair include regular soap and water washings. There has been colored discharge from the wound. There is new redness present. The swelling has worsened. The pain has worsened.    Past Medical History  Diagnosis Date  . GERD (gastroesophageal reflux disease)   . Hypertension   . Diabetes mellitus   . Hypercholesteremia   . Glaucoma     Past Surgical History  Procedure Date  . Abdominal hysterectomy   . Tonsillectomy     History reviewed. No pertinent family history.  History  Substance Use Topics  . Smoking status: Never Smoker   . Smokeless tobacco: Not on file  . Alcohol Use: No    OB History    Grav Para Term Preterm Abortions TAB SAB Ect Mult Living                  Review of Systems  Constitutional: Negative.   Respiratory: Negative.   Cardiovascular: Negative.   Musculoskeletal: Negative.   Skin: Positive for wound.    Physical Exam  BP 190/99  Pulse 99  Temp(Src) 98.3 F (36.8 C) (Oral)  Resp 20  Ht 5\' 3"  (1.6 m)  Wt 245 lb (111.131 kg)  BMI 43.40 kg/m2  SpO2 100%  Physical Exam  Nursing note and vitals reviewed. Constitutional: She is oriented to person, place, and time. She appears well-developed and well-nourished.  Cardiovascular: Normal rate and regular rhythm.   Pulmonary/Chest: Effort normal and breath sounds normal.  Neurological: She is alert and oriented to person, place, and time.  Skin:   Pt has a wound to the posterior right thigh with one side completely healed about a 2cm area that is open with mild amount of yellowish drainage to the area:no redness around the area noted    ED Course  Procedures  MDM Will treat for a mild infection:no fluctuance noted to be worried about abscess:pt to see her doctor in 2 days      Teressa Lower, NP 10/03/10 1945

## 2010-10-17 NOTE — ED Provider Notes (Signed)
Medical screening examination/treatment/procedure(s) were performed by non-physician practitioner and as supervising physician I was immediately available for consultation/collaboration.  Hurman Horn, MD 10/17/10 [phone]

## 2010-10-28 LAB — DIFFERENTIAL
Basophils Absolute: 0.1
Basophils Relative: 1
Eosinophils Absolute: 0.1
Eosinophils Relative: 2
Lymphocytes Relative: 17
Lymphs Abs: 1.2
Monocytes Absolute: 0.7
Monocytes Relative: 10
Neutro Abs: 5.3
Neutrophils Relative %: 71

## 2010-10-28 LAB — COMPREHENSIVE METABOLIC PANEL
ALT: 31
AST: 38 — ABNORMAL HIGH
Albumin: 4.5
Alkaline Phosphatase: 151 — ABNORMAL HIGH
BUN: 15
CO2: 23
Calcium: 10.4
Chloride: 103
Creatinine, Ser: 0.8
GFR calc Af Amer: >60
GFR calc non Af Amer: >60
Glucose, Bld: 167 — ABNORMAL HIGH
Potassium: 4.3
Sodium: 139
Total Bilirubin: 0.7
Total Protein: 8.3

## 2010-10-28 LAB — URINALYSIS, ROUTINE W REFLEX MICROSCOPIC
Bilirubin Urine: NEGATIVE
Glucose, UA: NEGATIVE
Hgb urine dipstick: NEGATIVE
Ketones, ur: NEGATIVE
Nitrite: NEGATIVE
Protein, ur: NEGATIVE
Specific Gravity, Urine: 1.016
Urobilinogen, UA: 1
pH: 7.5

## 2010-10-28 LAB — CBC
HCT: 40
Hemoglobin: 13.9
MCHC: 34.7
MCV: 86.2
Platelets: 209
RBC: 4.65
RDW: 12.9
WBC: 7.4

## 2010-10-28 LAB — LIPASE, BLOOD: Lipase: 57

## 2011-01-03 ENCOUNTER — Other Ambulatory Visit (HOSPITAL_COMMUNITY): Payer: Self-pay | Admitting: Endocrinology

## 2011-01-03 DIAGNOSIS — Z1231 Encounter for screening mammogram for malignant neoplasm of breast: Secondary | ICD-10-CM

## 2011-02-07 ENCOUNTER — Ambulatory Visit (HOSPITAL_COMMUNITY)
Admission: RE | Admit: 2011-02-07 | Discharge: 2011-02-07 | Disposition: A | Discharge status: Home / Self Care | Payer: No Typology Code available for payment source | Source: Ambulatory Visit | Attending: Endocrinology | Admitting: Endocrinology

## 2011-02-07 DIAGNOSIS — Z1231 Encounter for screening mammogram for malignant neoplasm of breast: Secondary | ICD-10-CM | POA: Insufficient documentation

## 2011-08-01 ENCOUNTER — Encounter: Payer: No Typology Code available for payment source | Attending: Endocrinology | Admitting: *Deleted

## 2011-08-01 ENCOUNTER — Encounter: Payer: Self-pay | Admitting: *Deleted

## 2011-08-01 VITALS — Ht 63.0 in | Wt 248.2 lb

## 2011-08-01 DIAGNOSIS — Z713 Dietary counseling and surveillance: Secondary | ICD-10-CM | POA: Insufficient documentation

## 2011-08-01 DIAGNOSIS — E119 Type 2 diabetes mellitus without complications: Secondary | ICD-10-CM | POA: Insufficient documentation

## 2011-08-01 NOTE — Progress Notes (Signed)
  Medical Nutrition Therapy:  Appt start time: 0800 end time:  0900.   Assessment:  Primary concerns today: daibetes.   MEDICATIONS: see list   DIETARY INTAKE:  Usual eating pattern includes 3 meals and 2 snacks per day.  Everyday foods include fast food.  Avoided foods include nuts, popcorn (sut to episode of diverticulitis.    24-hr recall:  B ( AM): [place] sausage or [place] bacon, egg beaters; mcdonald's meat biscuit.  Sometimes gets oatmeal with fruit; occasionally pancakes; special k or raisin bran cereal with lactaid 2% milk; decaff coffee with nondairy creamer or water Snk ( AM): none  L ( PM): wendy's small burger with baked potato and diet soda, sometimes gets salad; mcdonald's salad or sandwich and diet drink; occasionally gets apple pie; arby's roast beef sandwich, occasionally splits fries with husband Snk ( PM): 1/2 banana or apple; peanut butter crackers; mini bagel with low fat cream cheese D ( PM): fried chicken wings or meatloaf or some other meat with vegetables; sometimes has pasta side dish; has bread with dinner (roll or toast) Snk ( PM): most nights has chips Beverages: water or diet sodas  Usual physical activity: treadmill or elliptical machine 3 days a week  Estimated energy needs: 1400 calories 158 g carbohydrates 105 g protein 36 g fat  Progress Towards Goal(s):  In progress.   Nutritional Diagnosis:  NI-5.6.2 Excessive fat intake As related to high consumption of fatty meats and fast food.  As evidenced by obesity and hyperlipidemia.    Intervention:  Nutrition counseling provided.  Patient is here today with diabetes, obesity, and high cholesterol.  She has been diagnosed for 15 years and has a great understanding of diabetic meal plans.  She understands carb counting and portion control.  However, she has a high fat diet due to large meat breakfasts and fast food lunches, as well as frequently fried meat at dinner.  She exercises some at the gym, but  has limited intensity due to ankle pain.  She plans to see doctor about pain in joints.  Encouraged her to try stationary bike most often to increase physical activity.  Discussed limiting portions of meats and limiting dietary fats.  Encouraged more non-starchy vegetables and choosing healthier options when eating out  Handouts given during visit include: Carb Counting and Food Label handouts Meal Plan Card  Monitoring/Evaluation:  Dietary intake, exercise, BGM, and body weight in 1 month(s).

## 2011-08-01 NOTE — Patient Instructions (Addendum)
Goals:  Follow Diabetes Meal Plan as instructed  Eat 3 meals and 2 snacks, every 3-5 hrs  Limit carbohydrate intake to 30-45 grams carbohydrate/meal  Limit carbohydrate intake to 15 grams carbohydrate/snack  Add lean protein foods to meals/snacks  Monitor glucose levels as instructed by your doctor- aim to check daily  Aim for 30 mins of physical activity daily; talk with trainer about weight lifting 2-3 days a week  Bring food record and glucose log to your next nutrition visit  Talk with husband about changing lunch routine to make healthier choices

## 2011-09-05 ENCOUNTER — Encounter: Payer: No Typology Code available for payment source | Attending: Endocrinology | Admitting: *Deleted

## 2011-09-05 ENCOUNTER — Encounter: Payer: Self-pay | Admitting: *Deleted

## 2011-09-05 VITALS — Ht 63.0 in | Wt 250.0 lb

## 2011-09-05 DIAGNOSIS — Z713 Dietary counseling and surveillance: Secondary | ICD-10-CM | POA: Insufficient documentation

## 2011-09-05 DIAGNOSIS — E119 Type 2 diabetes mellitus without complications: Secondary | ICD-10-CM

## 2011-09-05 NOTE — Progress Notes (Signed)
  Medical Nutrition Therapy:  Appt start time: 0830 end time:  0900.   Assessment:  Primary concerns today: diabetes, obesity, hyperlipidemia follow up.   MEDICATIONS: see list   DIETARY INTAKE:  Usual eating pattern includes 2-3 meals and 2 snacks per day.  Everyday foods include fast food and other fatty meals.  Avoided foods include nuts/seeds etc due to diverticulitis.    24-hr recall:  B ( AM): [place] sausage or [place] bacon, egg beaters; mcdonald's meat biscuit. Sometimes gets oatmeal with fruit; occasionally pancakes; special k or raisin bran cereal with lactaid 2% milk; decaff coffee with nondairy creamer or water  Snk ( AM): none  L ( PM): wendy's small burger with baked potato and diet soda, sometimes gets salad; mcdonald's salad or sandwich and diet drink; occasionally gets apple pie; arby's roast beef sandwich, occasionally splits fries with husband  Snk ( PM): 1/2 banana or apple; peanut butter crackers; mini bagel with low fat cream cheese  D ( PM): fried chicken wings or meatloaf or some other meat with vegetables; sometimes has pasta side dish; has bread with dinner (roll or toast)  Snk ( PM): most nights has chips  Beverages: water or diet sodas   Usual physical activity: stationary bike 4 days a week and some weights  Estimated energy needs:  1400 calories  158 g carbohydrates  105 g protein  36 g fat   Progress Towards Goal(s):  Some progress.  [name] admits to strictly following recommendations for the first 2 weeks following our appointment.  But she went on vacation and has not stuck to recommendations since that time.  She has a family member coming to stay who is a Photographer and [name] is very concerned about the food that will be in the home in the near future.  She wants to make changes, but feels like she cant when her family dictates her food choices for her.     Nutritional Diagnosis:  Doran-3.3 Overweight/obesity As related to consumption of energy  dense foods.  As evidenced by BMI of 44, diabetes, and hyperlipidemia.    Intervention:  Nutrition counseling provided.  Applauded [name] for the successful changes her made immediately following her appointment.  Discussed ways she can continue to make changes while still pleasing her family.  Encouraged her to schedule family outings around meal schedule so she has time to eat balanced meal.  Encouraged her to take food from home when she meets her husband for lunch at fast food.  Encouraged her to have heart-to-heart with visiting chef relative about her health concerns and work with him to prepare healthier foods.  She puts her family ahead of her health and empowered her to do both.  Reviewed MyPlate for diabetics  Monitoring/Evaluation:  Dietary intake, exercise, BGM, and body weight in 6 week(s).

## 2011-09-05 NOTE — Patient Instructions (Signed)
Goals:  Follow Diabetes Meal Plan as instructed  Eat 3 meals and 2 snacks, every 3-5 hrs  Limit carbohydrate intake to 45-30 grams carbohydrate/meal  Limit carbohydrate intake to 15 grams carbohydrate/snack  Add lean protein foods to meals/snacks  Monitor glucose levels as instructed by your doctor  Aim for 30 mins of physical activity daily  Bring food record and glucose log to your next nutrition visit

## 2011-10-13 ENCOUNTER — Ambulatory Visit: Payer: No Typology Code available for payment source | Admitting: *Deleted

## 2012-02-16 ENCOUNTER — Other Ambulatory Visit (HOSPITAL_COMMUNITY): Payer: Self-pay | Admitting: Endocrinology

## 2012-02-16 DIAGNOSIS — Z1231 Encounter for screening mammogram for malignant neoplasm of breast: Secondary | ICD-10-CM

## 2012-03-01 ENCOUNTER — Ambulatory Visit (HOSPITAL_COMMUNITY): Payer: PRIVATE HEALTH INSURANCE

## 2012-03-08 ENCOUNTER — Ambulatory Visit (HOSPITAL_COMMUNITY)
Admission: RE | Admit: 2012-03-08 | Discharge: 2012-03-08 | Disposition: A | Discharge status: Home / Self Care | Payer: No Typology Code available for payment source | Source: Ambulatory Visit | Attending: Endocrinology | Admitting: Endocrinology

## 2012-03-08 DIAGNOSIS — Z1231 Encounter for screening mammogram for malignant neoplasm of breast: Secondary | ICD-10-CM | POA: Insufficient documentation

## 2012-04-17 ENCOUNTER — Encounter: Payer: Self-pay | Admitting: Internal Medicine

## 2012-04-17 ENCOUNTER — Ambulatory Visit (INDEPENDENT_AMBULATORY_CARE_PROVIDER_SITE_OTHER): Payer: No Typology Code available for payment source | Admitting: Internal Medicine

## 2012-04-17 VITALS — BP 143/94 | HR 80 | Temp 97.7°F | Wt 249.0 lb

## 2012-04-17 DIAGNOSIS — A150 Tuberculosis of lung: Secondary | ICD-10-CM

## 2012-04-17 DIAGNOSIS — R7612 Nonspecific reaction to cell mediated immunity measurement of gamma interferon antigen response without active tuberculosis: Secondary | ICD-10-CM

## 2012-04-17 DIAGNOSIS — Z227 Latent tuberculosis: Secondary | ICD-10-CM

## 2012-04-17 NOTE — Progress Notes (Signed)
[facility] NOTE  RFV: community referral for positive quantiferon Subjective:    Patient ID: [name], female    DOB: 08-30-1945, 67 y.o.   MRN: [id number]  HPI Verity is a 67yo F with DM, HTN, DJD, and glaucoma, who has been diagnosed with glaucoma, with right implant on Jan 10, 2012 but also concurrently had bouts of inflammation where there was concern for auto-immune process. She was referred to our clinic due to having positive quantiferon, but has no risk factors. She reported having chest xray which was normal.  Went to back to school after having kids in [place], finished in 1981.BA in business administration. Worked for [employer] for 20 years, doing loans, Paediatric nurse, credit, travelled in the [place] only. Retired in 2001. Started to participate in [employer], bowling, and occ.travel. For leisure, went to [place]. No travel overseas. Has an uncle who lives in [place] who stays 6 wks per year.   She reports having TB skin test in school in the late 1960s where she was negative.  No international travel, no volunteering for soup kitchen, or church/homeless shelters   Worked at [employer] in the early [phone].    She reports that briefly, she had a daughter in law (about a year or so) from [place], no longer married to her son.   No cough, fever, chills, nightsweats, no unintentional weight loss. Current Outpatient Prescriptions on File Prior to Visit  Medication Sig Dispense Refill  . amLODipine-valsartan (EXFORGE) 10-320 MG per tablet Take 1 tablet by mouth daily.        Marland Kitchen aspirin EC 81 MG tablet Take 81 mg by mouth daily.        Marland Kitchen atorvastatin (LIPITOR) 20 MG tablet Take 20 mg by mouth daily.        . brimonidine-timolol (COMBIGAN) 0.2-0.5 % ophthalmic solution Place 1 drop into both eyes every 12 (twelve) hours.        Marland Kitchen esomeprazole (NEXIUM) 40 MG capsule Take 40 mg by mouth daily as needed. Acid reflux       . meloxicam (MOBIC) 15 MG tablet Take 15 mg by mouth daily as  needed. pain       . metFORMIN (GLUCOPHAGE) 500 MG tablet Take 500 mg by mouth 2 (two) times daily with a meal.        . vitamin E 400 UNIT capsule Take 400 Units by mouth daily.        . bimatoprost (LUMIGAN) 0.03 % ophthalmic solution Place 1 drop into both eyes at bedtime.        . brinzolamide (AZOPT) 1 % ophthalmic suspension Place 1 drop into both eyes 3 (three) times daily.        Marland Kitchen glipiZIDE (GLUCOTROL XL) 5 MG 24 hr tablet Take 5 mg by mouth daily.         No current facility-administered medications on file prior to visit.   Active Ambulatory Problems    Diagnosis Date Noted  . DM 05/26/2008  . HYPERCHOLESTEROLEMIA  IIA 05/26/2008  . OVERWEIGHT/OBESITY 05/26/2008  . HYPERTENSION, UNSPECIFIED 05/26/2008  . DEGENERATIVE JOINT DISEASE 05/26/2008   Resolved Ambulatory Problems    Diagnosis Date Noted  . No Resolved Ambulatory Problems   Past Medical History  Diagnosis Date  . GERD (gastroesophageal reflux disease)   . Hypertension   . Diabetes mellitus   . Hypercholesteremia   . Glaucoma(365)   . Obesity   . Glaucoma    History   Social  History  . Marital Status: Married    Spouse Name: N/A    Number of Children: N/A  . Years of Education: N/A   Occupational History  . Not on file.   Social History Main Topics  . Smoking status: Never Smoker   . Smokeless tobacco: Not on file  . Alcohol Use: No  . Drug Use: No  . Sexually Active:    Other Topics Concern  . Not on file   Social History Narrative  . No narrative on file   family history includes Cancer in her other and Hyperlipidemia in her other.  Review of Systems 10 point ROS negative    Objective:   Physical Exam BP 143/94  Pulse 80  Temp(Src) 97.7 F (36.5 C) (Oral)  Wt 249 lb (112.946 kg)  BMI 44.12 kg/m2 Physical Exam  Constitutional:  oriented to person, place, and time. appears well-developed and well-nourished. No distress.  HENT:  Mouth/Throat: Oropharynx is clear and moist. No  oropharyngeal exudate.  Cardiovascular: Normal rate, regular rhythm and normal heart sounds. Exam reveals no gallop and no friction rub.  No murmur heard.  Pulmonary/Chest: Effort normal and breath sounds normal. No respiratory distress. He has no wheezes.  Lymphadenopathy:  no cervical adenopathy.  Neurological:  alert and oriented to person, place, and time.  Skin: Skin is warm and dry. No rash noted. No erythema.  Psychiatric:  a normal mood and affect.  behavior is normal.      Assessment & Plan:  - Will repeat quantiferon to see if false positive - Discussed treatment option for latent tb if she is interested in taking therapy. We did discussed that she may have risk to active tb of 5% over the rest of her lifetime.  Addendum: quantiferon was positive. We discussed results. She is not interested in pursuing latent tb treatment at this time. i mentioned she can come back to [place] or see health dept if interested in TB. If she develops any fever ,chills, nighsweats or chronic cough, i recommended she come back to see [place].

## 2012-04-19 LAB — QUANTIFERON TB GOLD ASSAY (BLOOD)
Interferon Gamma Release Assay: POSITIVE — AB
Mitogen value: 0.85 IU/mL
Quantiferon Nil Value: 0.25 IU/mL
Quantiferon Tb Ag Minus Nil Value: 4.58 IU/mL
TB Ag value: 4.83 IU/mL

## 2012-04-26 ENCOUNTER — Telehealth: Payer: Self-pay | Admitting: *Deleted

## 2012-04-26 NOTE — Telephone Encounter (Signed)
Patient called to get the results of her [name]. Advised her it is in but will have to defer to the doctor as she will have questions I can not answer. Advised the patient will advise the doctor that she called.

## 2012-08-23 ENCOUNTER — Other Ambulatory Visit: Payer: Self-pay | Admitting: Endocrinology

## 2012-08-23 DIAGNOSIS — R1310 Dysphagia, unspecified: Secondary | ICD-10-CM

## 2012-08-27 ENCOUNTER — Ambulatory Visit
Admission: RE | Admit: 2012-08-27 | Discharge: 2012-08-27 | Disposition: A | Discharge status: Home / Self Care | Payer: PRIVATE HEALTH INSURANCE | Source: Ambulatory Visit | Attending: Endocrinology | Admitting: Endocrinology

## 2012-08-27 DIAGNOSIS — R1310 Dysphagia, unspecified: Secondary | ICD-10-CM

## 2013-04-02 ENCOUNTER — Emergency Department (HOSPITAL_BASED_OUTPATIENT_CLINIC_OR_DEPARTMENT_OTHER): Payer: Medicare Other

## 2013-04-02 ENCOUNTER — Emergency Department (HOSPITAL_BASED_OUTPATIENT_CLINIC_OR_DEPARTMENT_OTHER)
Admission: EM | Admit: 2013-04-02 | Discharge: 2013-04-02 | Disposition: A | Discharge status: Home / Self Care | Payer: Medicare Other | Attending: Emergency Medicine | Admitting: Emergency Medicine

## 2013-04-02 ENCOUNTER — Encounter (HOSPITAL_BASED_OUTPATIENT_CLINIC_OR_DEPARTMENT_OTHER): Payer: Self-pay | Admitting: Emergency Medicine

## 2013-04-02 DIAGNOSIS — E119 Type 2 diabetes mellitus without complications: Secondary | ICD-10-CM | POA: Insufficient documentation

## 2013-04-02 DIAGNOSIS — R197 Diarrhea, unspecified: Secondary | ICD-10-CM | POA: Insufficient documentation

## 2013-04-02 DIAGNOSIS — R1084 Generalized abdominal pain: Secondary | ICD-10-CM | POA: Insufficient documentation

## 2013-04-02 DIAGNOSIS — E7800 Pure hypercholesterolemia, unspecified: Secondary | ICD-10-CM | POA: Insufficient documentation

## 2013-04-02 DIAGNOSIS — R630 Anorexia: Secondary | ICD-10-CM | POA: Insufficient documentation

## 2013-04-02 DIAGNOSIS — Z7982 Long term (current) use of aspirin: Secondary | ICD-10-CM | POA: Insufficient documentation

## 2013-04-02 DIAGNOSIS — R109 Unspecified abdominal pain: Secondary | ICD-10-CM

## 2013-04-02 DIAGNOSIS — R6883 Chills (without fever): Secondary | ICD-10-CM | POA: Insufficient documentation

## 2013-04-02 DIAGNOSIS — Z79899 Other long term (current) drug therapy: Secondary | ICD-10-CM | POA: Insufficient documentation

## 2013-04-02 DIAGNOSIS — H409 Unspecified glaucoma: Secondary | ICD-10-CM | POA: Insufficient documentation

## 2013-04-02 DIAGNOSIS — K219 Gastro-esophageal reflux disease without esophagitis: Secondary | ICD-10-CM | POA: Insufficient documentation

## 2013-04-02 DIAGNOSIS — R61 Generalized hyperhidrosis: Secondary | ICD-10-CM | POA: Insufficient documentation

## 2013-04-02 DIAGNOSIS — I10 Essential (primary) hypertension: Secondary | ICD-10-CM | POA: Insufficient documentation

## 2013-04-02 DIAGNOSIS — E669 Obesity, unspecified: Secondary | ICD-10-CM | POA: Insufficient documentation

## 2013-04-02 LAB — COMPREHENSIVE METABOLIC PANEL
ALT: 42 U/L — ABNORMAL HIGH (ref 0–35)
AST: 50 U/L — ABNORMAL HIGH (ref 0–37)
Albumin: 3.6 g/dL (ref 3.5–5.2)
Alkaline Phosphatase: 129 U/L — ABNORMAL HIGH (ref 39–117)
BUN: 19 mg/dL (ref 6–23)
CO2: 21 mEq/L (ref 19–32)
Calcium: 10.3 mg/dL (ref 8.4–10.5)
Chloride: 101 mEq/L (ref 96–112)
Creatinine, Ser: 1 mg/dL (ref 0.50–1.10)
GFR calc Af Amer: 67 mL/min — ABNORMAL LOW (ref >90)
GFR calc non Af Amer: 58 mL/min — ABNORMAL LOW (ref >90)
Glucose, Bld: 184 mg/dL — ABNORMAL HIGH (ref 70–99)
Potassium: 4.6 mEq/L (ref 3.7–5.3)
Sodium: 136 mEq/L — ABNORMAL LOW (ref 137–147)
Total Bilirubin: 0.8 mg/dL (ref 0.3–1.2)
Total Protein: 7.8 g/dL (ref 6.0–8.3)

## 2013-04-02 LAB — CBC WITH DIFFERENTIAL/PLATELET
Basophils Absolute: 0 10*3/uL (ref 0.0–0.1)
Basophils Relative: 0 % (ref 0–1)
Eosinophils Absolute: 0.1 10*3/uL (ref 0.0–0.7)
Eosinophils Relative: 2 % (ref 0–5)
HCT: 39.4 % (ref 36.0–46.0)
Hemoglobin: 13.3 g/dL (ref 12.0–15.0)
Lymphocytes Relative: 4 % — ABNORMAL LOW (ref 12–46)
Lymphs Abs: 0.3 10*3/uL — ABNORMAL LOW (ref 0.7–4.0)
MCH: 29.6 pg (ref 26.0–34.0)
MCHC: 33.8 g/dL (ref 30.0–36.0)
MCV: 87.6 fL (ref 78.0–100.0)
Monocytes Absolute: 0.5 10*3/uL (ref 0.1–1.0)
Monocytes Relative: 8 % (ref 3–12)
Neutro Abs: 5.6 10*3/uL (ref 1.7–7.7)
Neutrophils Relative %: 86 % — ABNORMAL HIGH (ref 43–77)
Platelets: 205 10*3/uL (ref 150–400)
RBC: 4.5 MIL/uL (ref 3.87–5.11)
RDW: 13.1 % (ref 11.5–15.5)
WBC: 6.5 10*3/uL (ref 4.0–10.5)

## 2013-04-02 LAB — URINALYSIS, ROUTINE W REFLEX MICROSCOPIC
Bilirubin Urine: NEGATIVE
Glucose, UA: NEGATIVE mg/dL
Ketones, ur: NEGATIVE mg/dL
Leukocytes, UA: NEGATIVE
Nitrite: NEGATIVE
Protein, ur: NEGATIVE mg/dL
Specific Gravity, Urine: 1.019 (ref 1.005–1.030)
Urobilinogen, UA: 0.2 mg/dL (ref 0.0–1.0)
pH: 5.5 (ref 5.0–8.0)

## 2013-04-02 LAB — URINE MICROSCOPIC-ADD ON

## 2013-04-02 LAB — LIPASE, BLOOD: Lipase: 29 U/L (ref 11–59)

## 2013-04-02 MED ORDER — ONDANSETRON HCL 4 MG/2ML IJ SOLN
4.0000 mg | Freq: Once | INTRAMUSCULAR | Status: AC
  Administered 2013-04-02: 4 mg via INTRAVENOUS
  Filled 2013-04-02: qty 2

## 2013-04-02 MED ORDER — IOHEXOL 300 MG/ML  SOLN
50.0000 mL | Freq: Once | INTRAMUSCULAR | Status: AC | PRN
  Administered 2013-04-02: 50 mL via ORAL

## 2013-04-02 MED ORDER — SODIUM CHLORIDE 0.9 % IV BOLUS (SEPSIS)
1000.0000 mL | Freq: Once | INTRAVENOUS | Status: AC
  Administered 2013-04-02: 1000 mL via INTRAVENOUS

## 2013-04-02 MED ORDER — OXYCODONE-ACETAMINOPHEN 5-325 MG PO TABS
1.0000 | ORAL_TABLET | Freq: Four times a day (QID) | ORAL | Status: DC | PRN
Start: 2013-04-02 — End: 2021-09-09

## 2013-04-02 MED ORDER — IOHEXOL 300 MG/ML  SOLN
100.0000 mL | Freq: Once | INTRAMUSCULAR | Status: AC | PRN
  Administered 2013-04-02: 100 mL via INTRAVENOUS

## 2013-04-02 MED ORDER — MORPHINE SULFATE 4 MG/ML IJ SOLN
6.0000 mg | Freq: Once | INTRAMUSCULAR | Status: AC
  Administered 2013-04-02: 6 mg via INTRAVENOUS
  Filled 2013-04-02: qty 2

## 2013-04-02 NOTE — ED Notes (Signed)
Pt is drinking diet sprite and eating crackers.

## 2013-04-02 NOTE — ED Provider Notes (Addendum)
CSN: [id number]     Arrival date & time 04/02/13  1038 History   First MD Initiated Contact with Patient 04/02/13 1109     Chief Complaint  Patient presents with  . Weakness     (Consider location/radiation/quality/duration/timing/severity/associated sxs/prior Treatment) Patient is a 68 y.o. female presenting with abdominal pain. The history is provided by the patient.  Abdominal Pain Pain location: generalized lower abd pain. Pain quality: cramping, gnawing, sharp and shooting   Pain radiates to:  Does not radiate Pain severity:  Severe Onset quality:  Gradual Duration:  6 hours Timing:  Constant Progression:  Worsening Chronicity:  New Context: awakening from sleep   Relieved by:  Nothing Worsened by:  Nothing tried Ineffective treatments:  Antacids Associated symptoms: anorexia, chills, diarrhea and nausea   Associated symptoms: no chest pain, no dysuria, no fever, no shortness of breath and no vomiting   Diarrhea:    Quality:  Bloody   Number of occurrences:  Numerous   Severity:  Severe   Timing:  Constant   Progression:  Worsening Risk factors: being elderly   Risk factors: no alcohol abuse, has not had multiple surgeries, no NSAID use and no recent hospitalization     Past Medical History  Diagnosis Date  . GERD (gastroesophageal reflux disease)   . Hypertension   . Diabetes mellitus   . Hypercholesteremia   . Glaucoma   . Obesity   . Glaucoma    Past Surgical History  Procedure Laterality Date  . Abdominal hysterectomy    . Tonsillectomy    . Eye surgery     Family History  Problem Relation Age of Onset  . Cancer Other   . Hyperlipidemia Other    History  Substance Use Topics  . Smoking status: Never Smoker   . Smokeless tobacco: Not on file  . Alcohol Use: No   OB History   Grav Para Term Preterm Abortions TAB SAB Ect Mult Living                 Review of Systems  Constitutional: Positive for chills. Negative for fever.  Respiratory:  Negative for shortness of breath.   Cardiovascular: Negative for chest pain.  Gastrointestinal: Positive for nausea, abdominal pain, diarrhea and anorexia. Negative for vomiting.  Genitourinary: Negative for dysuria.  All other systems reviewed and are negative.      Allergies  Hydrocodone-acetaminophen and Sulfonamide derivatives  Home Medications   Current Outpatient Rx  Name  Route  Sig  Dispense  Refill  . amLODipine-valsartan (EXFORGE) 10-320 MG per tablet   Oral   Take 1 tablet by mouth daily.           Marland Kitchen aspirin EC 81 MG tablet   Oral   Take 81 mg by mouth daily.           Marland Kitchen atorvastatin (LIPITOR) 20 MG tablet   Oral   Take 20 mg by mouth daily.           . bimatoprost (LUMIGAN) 0.03 % ophthalmic solution   Both Eyes   Place 1 drop into both eyes at bedtime.           . brimonidine-timolol (COMBIGAN) 0.2-0.5 % ophthalmic solution   Both Eyes   Place 1 drop into both eyes every 12 (twelve) hours.           . brinzolamide (AZOPT) 1 % ophthalmic suspension   Both Eyes   Place 1 drop into both eyes 3 (  three) times daily.           Marland Kitchen esomeprazole (NEXIUM) 40 MG capsule   Oral   Take 40 mg by mouth daily as needed. Acid reflux          . glipiZIDE (GLUCOTROL XL) 5 MG 24 hr tablet   Oral   Take 5 mg by mouth daily.           . meloxicam (MOBIC) 15 MG tablet   Oral   Take 15 mg by mouth daily as needed. pain          . metFORMIN (GLUCOPHAGE) 500 MG tablet   Oral   Take 500 mg by mouth 2 (two) times daily with a meal.           . nebivolol (BYSTOLIC) 5 MG tablet   Oral   Take 5 mg by mouth daily.         . vitamin E 400 UNIT capsule   Oral   Take 400 Units by mouth daily.            BP 154/81  Pulse 85  SpO2 99% Physical Exam  Nursing note and vitals reviewed. Constitutional: She is oriented to person, place, and time. She appears well-developed and well-nourished. She appears distressed.  Appears uncomfortable  HENT:   Head: Normocephalic and atraumatic.  Mouth/Throat: Oropharynx is clear and moist.  Eyes: Conjunctivae and EOM are normal. Pupils are equal, round, and reactive to light.  Neck: Normal range of motion. Neck supple.  Cardiovascular: Normal rate, regular rhythm and intact distal pulses.   No murmur heard. Pulmonary/Chest: Effort normal and breath sounds normal. No respiratory distress. She has no wheezes. She has no rales.  Abdominal: Soft. She exhibits no distension. There is tenderness in the right lower quadrant, suprapubic area and left lower quadrant. There is no rebound and no guarding.  Musculoskeletal: Normal range of motion. She exhibits no edema and no tenderness.  Neurological: She is alert and oriented to person, place, and time.  Skin: Skin is warm. No rash noted. She is diaphoretic. No erythema.  Psychiatric: She has a normal mood and affect. Her behavior is normal.    ED Course  Procedures (including critical care time) Labs Review Labs Reviewed  CBC WITH DIFFERENTIAL - Abnormal; Notable for the following:    Neutrophils Relative % 86 (*)    Lymphocytes Relative 4 (*)    Lymphs Abs 0.3 (*)    All other components within normal limits  COMPREHENSIVE METABOLIC PANEL - Abnormal; Notable for the following:    Sodium 136 (*)    Glucose, Bld 184 (*)    AST 50 (*)    ALT 42 (*)    Alkaline Phosphatase 129 (*)    GFR calc non Af Amer 58 (*)    GFR calc Af Amer 67 (*)    All other components within normal limits  URINALYSIS, ROUTINE W REFLEX MICROSCOPIC - Abnormal; Notable for the following:    Hgb urine dipstick SMALL (*)    All other components within normal limits  URINE MICROSCOPIC-ADD ON - Abnormal; Notable for the following:    Bacteria, UA FEW (*)    Casts HYALINE CASTS (*)    All other components within normal limits  LIPASE, BLOOD   Imaging Review Ct Abdomen Pelvis W Contrast  04/02/2013   CLINICAL DATA:  Lower abdominal pain and bloody diarrhea.  EXAM: CT  ABDOMEN AND PELVIS WITH CONTRAST  TECHNIQUE: Multidetector CT  imaging of the abdomen and pelvis was performed using the standard protocol following bolus administration of intravenous contrast.  CONTRAST:  16mL OMNIPAQUE IOHEXOL 300 MG/ML SOLN, 163mL OMNIPAQUE IOHEXOL 300 MG/ML SOLN  COMPARISON:  07/29/2007.  FINDINGS: The lung bases demonstrate dependent bibasilar atelectasis. The heart is upper limits of normal in size. Coronary artery calcifications are noted. The distal esophagus is grossly normal.  The liver is unremarkable. No focal hepatic lesions or intrahepatic biliary dilatation. The gallbladder is normal. No common bile duct dilatation. The pancreas is normal. The spleen is normal in size. No focal lesions. The adrenal glands are normal and stable. The right kidney is under bone significant atrophy since the prior CT scan. This also compensatory hypertrophy of the left kidney. Findings likely due to renal artery stenosis. No renal mass lesions.  The stomach, duodenum, small bowel and colon are unremarkable except for significant sigmoid diverticulosis. No definite findings for acute diverticulitis. No mesenteric or retroperitoneal mass or adenopathy. The aorta demonstrates moderate atherosclerotic calcifications and tortuosity. No focal aneurysm or dissection. The major branch vessels are patent except the right renal artery is narrowed and there is calcifications at the ostia. Small scattered mesenteric and retroperitoneal lymph nodes.  The uterus is surgically absent. No pelvic mass or adenopathy. The bladder is unremarkable. No free pelvic fluid collections. No inguinal mass or adenopathy.  The bony structures are stable. There is degenerative changes involving the SI joints and lumbar spine.  IMPRESSION: 1. Severe diverticulosis involving the sigmoid colon without findings for acute diverticulitis. 2. Interval development of significant atrophy of the right kidney likely due to renal artery stenosis.  There is compensatory hypertrophy of the left kidney. 3. Bibasilar atelectasis and three-vessel coronary artery calcifications.   Electronically Signed   By: Kalman Jewels M.D.   On: 04/02/2013 14:18     EKG Interpretation   Date/Time:  Tuesday April 02 2013 12:20:59 EST Ventricular Rate:  82 PR Interval:  146 QRS Duration: 72 QT Interval:  366 QTC Calculation: 427 R Axis:   29 Text Interpretation:  Normal sinus rhythm Nonspecific T wave abnormality  No significant change since last tracing Reconfirmed by [facility]  MD,  Loree Fee ([id number]) on 04/02/2013 3:43:47 PM      MDM   Final diagnoses:  Bloody diarrhea  Abdominal cramping   Patient presents abdominal cramping and diarrhea that started at 4:30 this morning and has worsened. Initially she had normal wrist which turned into completely bloody stool. She has no history of Crohn's disease or ulcerative colitis. She has had one prior episode similar to this that was diverticulitis. She is hemodynamically stable but appears extremely uncomfortable and is slightly diaphoretic. She denies any recent antibiotic use or concerning for an infectious diarrhea. She is on no anticoagulation except for 81 mg of aspirin. Concern for colitis versus diverticulitis versus viral cause of her symptoms. Patient was given fluid and pain control. CBC within normal limits. CMP showed mild elevation of LFTs but otherwise normal. UA is negative and lipase was within normal limits. CT of the abdomen pending for further evaluation.  2:48 PM Pt feeling much better.  Here she has had 1 bloody bowel movement with dark clot without bright red blood.  CT neg for colitis or diverticulitis.  Blood counts wnl and pt will hold ASA for now.  Will po challenge to see if pt can tolerate po's.  3:41 PM After po's pt is feeling better and abd pain did not return.  Pt able  to get up and ambulate without feeling lightheaded or dizzy.  Pt will pay close attention to stools and if  bleeding continues or becomes more frequent or she develops SOB, CP or lightheadedness she will return.  She will f/u with PCP tomorrow.  Pt and family are comfortable with plan.   Blanchie Dessert, MD 04/02/13 [place], MD 04/02/13 1546

## 2013-04-02 NOTE — ED Notes (Signed)
Diarrhea since early am. Abdominal cramps. Diaphoretic and ashen color on arrival.

## 2013-04-02 NOTE — ED Notes (Signed)
Patient transported to CT 

## 2013-04-02 NOTE — ED Notes (Signed)
After medication her oxygen sats dropped to 87%. resp shallow. She was placed on nasal oxygen at 3l/m with improvement to 96%. She was sitting on the side of the bed attempting to move to the bedside commode with assistance. Her eyes were closed and she became diaphoretic and arousable  to name call. Placed on cardiac monitor. NSR with PAC's noted. EKG ordered. She denies chest pain. Placed flat on stretcher. Granddaughter at the bedside. Pt is sleeping and skin dry in minutes after laying flat. Call bell at bedside. Side rails up.

## 2013-05-27 ENCOUNTER — Other Ambulatory Visit (HOSPITAL_COMMUNITY): Payer: Self-pay | Admitting: Endocrinology

## 2013-05-27 DIAGNOSIS — Z1231 Encounter for screening mammogram for malignant neoplasm of breast: Secondary | ICD-10-CM

## 2013-06-01 ENCOUNTER — Emergency Department (HOSPITAL_BASED_OUTPATIENT_CLINIC_OR_DEPARTMENT_OTHER)
Admission: EM | Admit: 2013-06-01 | Discharge: 2013-06-01 | Disposition: A | Discharge status: Home / Self Care | Payer: Medicare Other | Attending: Emergency Medicine | Admitting: Emergency Medicine

## 2013-06-01 ENCOUNTER — Encounter (HOSPITAL_BASED_OUTPATIENT_CLINIC_OR_DEPARTMENT_OTHER): Payer: Self-pay | Admitting: Emergency Medicine

## 2013-06-01 ENCOUNTER — Emergency Department (HOSPITAL_BASED_OUTPATIENT_CLINIC_OR_DEPARTMENT_OTHER): Payer: Medicare Other

## 2013-06-01 DIAGNOSIS — H409 Unspecified glaucoma: Secondary | ICD-10-CM | POA: Insufficient documentation

## 2013-06-01 DIAGNOSIS — E669 Obesity, unspecified: Secondary | ICD-10-CM | POA: Insufficient documentation

## 2013-06-01 DIAGNOSIS — Z7982 Long term (current) use of aspirin: Secondary | ICD-10-CM | POA: Insufficient documentation

## 2013-06-01 DIAGNOSIS — R071 Chest pain on breathing: Secondary | ICD-10-CM | POA: Insufficient documentation

## 2013-06-01 DIAGNOSIS — I10 Essential (primary) hypertension: Secondary | ICD-10-CM | POA: Insufficient documentation

## 2013-06-01 DIAGNOSIS — R0789 Other chest pain: Secondary | ICD-10-CM

## 2013-06-01 DIAGNOSIS — E7800 Pure hypercholesterolemia, unspecified: Secondary | ICD-10-CM | POA: Insufficient documentation

## 2013-06-01 DIAGNOSIS — E119 Type 2 diabetes mellitus without complications: Secondary | ICD-10-CM | POA: Insufficient documentation

## 2013-06-01 DIAGNOSIS — K219 Gastro-esophageal reflux disease without esophagitis: Secondary | ICD-10-CM | POA: Insufficient documentation

## 2013-06-01 DIAGNOSIS — Z8611 Personal history of tuberculosis: Secondary | ICD-10-CM | POA: Insufficient documentation

## 2013-06-01 DIAGNOSIS — Z79899 Other long term (current) drug therapy: Secondary | ICD-10-CM | POA: Insufficient documentation

## 2013-06-01 LAB — CBC
HCT: 37.3 % (ref 36.0–46.0)
Hemoglobin: 12.4 g/dL (ref 12.0–15.0)
MCH: 28.8 pg (ref 26.0–34.0)
MCHC: 33.2 g/dL (ref 30.0–36.0)
MCV: 86.7 fL (ref 78.0–100.0)
Platelets: 230 10*3/uL (ref 150–400)
RBC: 4.3 MIL/uL (ref 3.87–5.11)
RDW: 13.5 % (ref 11.5–15.5)
WBC: 6.5 10*3/uL (ref 4.0–10.5)

## 2013-06-01 LAB — COMPREHENSIVE METABOLIC PANEL
ALT: 17 U/L (ref 0–35)
AST: 19 U/L (ref 0–37)
Albumin: 3.6 g/dL (ref 3.5–5.2)
Alkaline Phosphatase: 107 U/L (ref 39–117)
BUN: 19 mg/dL (ref 6–23)
CO2: 23 mEq/L (ref 19–32)
Calcium: 10.3 mg/dL (ref 8.4–10.5)
Chloride: 102 mEq/L (ref 96–112)
Creatinine, Ser: 1 mg/dL (ref 0.50–1.10)
GFR calc Af Amer: 67 mL/min — ABNORMAL LOW (ref >90)
GFR calc non Af Amer: 58 mL/min — ABNORMAL LOW (ref >90)
Glucose, Bld: 221 mg/dL — ABNORMAL HIGH (ref 70–99)
Potassium: 4.3 mEq/L (ref 3.7–5.3)
Sodium: 139 mEq/L (ref 137–147)
Total Bilirubin: 0.3 mg/dL (ref 0.3–1.2)
Total Protein: 7.7 g/dL (ref 6.0–8.3)

## 2013-06-01 LAB — TROPONIN I
Troponin I: <0.3 ng/mL (ref <0.30)
Troponin I: <0.3 ng/mL (ref <0.30)

## 2013-06-01 LAB — PRO B NATRIURETIC PEPTIDE: Pro B Natriuretic peptide (BNP): 274.8 pg/mL — ABNORMAL HIGH (ref 0–125)

## 2013-06-01 LAB — D-DIMER, QUANTITATIVE: D-Dimer, Quant: 0.74 ug/mL-FEU — ABNORMAL HIGH (ref 0.00–0.48)

## 2013-06-01 MED ORDER — CYCLOBENZAPRINE HCL 5 MG PO TABS: 5.0000 mg | ORAL_TABLET | Freq: Three times a day (TID) | ORAL | Status: DC | PRN

## 2013-06-01 MED ORDER — IBUPROFEN 600 MG PO TABS: 600.0000 mg | ORAL_TABLET | Freq: Four times a day (QID) | ORAL | Status: DC | PRN

## 2013-06-01 MED ORDER — KETOROLAC TROMETHAMINE 30 MG/ML IJ SOLN
30.0000 mg | Freq: Once | INTRAMUSCULAR | Status: AC
  Administered 2013-06-01: 30 mg via INTRAVENOUS
  Filled 2013-06-01: qty 1

## 2013-06-01 NOTE — Discharge Instructions (Signed)

## 2013-06-01 NOTE — ED Notes (Signed)
Onset of crushing right sided chest pain approx 40 minutes ago.  Denies SHOB, nausea, diaphoresis.  Reports had a normal day without any other symptoms of illness.  States feels like something is 'sitting on my chest.'

## 2013-06-01 NOTE — ED Provider Notes (Signed)
CSN: [id number]     Arrival date & time 06/01/13  1918 History  This chart was scribed for Dot Lanes, MD by Elby Beck, ED Scribe. This patient was seen in room MH12/MH12 and the patient's care was started at 8:05 PM.   Chief Complaint  Patient presents with  . Chest Pain    The history is provided by the patient. No language interpreter was used.    HPI Comments: [name] is a 68 y.o. female with a history of HTN, HCL, DM and GERD who presents to the Emergency Department complaining of constant, moderate right-sided chest pain that radiates to her right axilla and right upper back onset about 1.5 hours ago. She states that the pain onset after walking from her living room to her kitchen. She states that her pain was "crushing" at onset, and that it has improved some since onset, and she now describes her pain as "pulling". She states that her pain is worsened with deep inspiration and with sitting up. She denies any known history of gallbladder disease. She denies any injury to have onset her pain. She denies any diaphoresis, nausea, emesis or any other associated pain or symptoms. She states that her 22 year old sister had an MI this year. She denies any other family history of MI. She states that she has never been a smoker. She states that she has been diagnosed with TB in the past, but that she was asymptomatic.    Past Medical History  Diagnosis Date  . GERD (gastroesophageal reflux disease)   . Hypertension   . Diabetes mellitus   . Hypercholesteremia   . Glaucoma   . Obesity   . Glaucoma    Past Surgical History  Procedure Laterality Date  . Abdominal hysterectomy    . Tonsillectomy    . Eye surgery     Family History  Problem Relation Age of Onset  . Cancer Other   . Hyperlipidemia Other    History  Substance Use Topics  . Smoking status: Never Smoker   . Smokeless tobacco: Not on file  . Alcohol Use: No   OB History   Grav Para Term Preterm  Abortions TAB SAB Ect Mult Living                 Review of Systems  Constitutional: Negative for diaphoresis.  Cardiovascular: Positive for chest pain.  Gastrointestinal: Negative for nausea and vomiting.  All other systems reviewed and are negative.   Allergies  Hydrocodone-acetaminophen and Sulfonamide derivatives  Home Medications   Prior to Admission medications   Medication Sig Start Date End Date Taking? Authorizing Provider  amLODipine-valsartan (EXFORGE) 10-320 MG per tablet Take 1 tablet by mouth daily.      Historical Provider, MD  aspirin EC 81 MG tablet Take 81 mg by mouth daily.      Historical Provider, MD  atorvastatin (LIPITOR) 20 MG tablet Take 20 mg by mouth daily.      Historical Provider, MD  bimatoprost (LUMIGAN) 0.03 % ophthalmic solution Place 1 drop into both eyes at bedtime.      Historical Provider, MD  brimonidine-timolol (COMBIGAN) 0.2-0.5 % ophthalmic solution Place 1 drop into both eyes every 12 (twelve) hours.      Historical Provider, MD  brinzolamide (AZOPT) 1 % ophthalmic suspension Place 1 drop into both eyes 3 (three) times daily.      Historical Provider, MD  esomeprazole (NEXIUM) 40 MG capsule Take 40 mg by  mouth daily as needed. Acid reflux     Historical Provider, MD  glipiZIDE (GLUCOTROL XL) 5 MG 24 hr tablet Take 5 mg by mouth daily.      Historical Provider, MD  meloxicam (MOBIC) 15 MG tablet Take 15 mg by mouth daily as needed. pain     Historical Provider, MD  metFORMIN (GLUCOPHAGE) 500 MG tablet Take 500 mg by mouth 2 (two) times daily with a meal.      Historical Provider, MD  nebivolol (BYSTOLIC) 5 MG tablet Take 5 mg by mouth daily.    Historical Provider, MD  oxyCODONE-acetaminophen (PERCOCET/ROXICET) 5-325 MG per tablet Take 1 tablet by mouth every 6 (six) hours as needed for severe pain. 04/02/13   Blanchie Dessert, MD  vitamin E 400 UNIT capsule Take 400 Units by mouth daily.      Historical Provider, MD   Triage Vitals: BP 178/96   Pulse 88  Temp(Src) 98.6 F (37 C)  Resp 18  Ht 5\' 3"  (1.6 m)  SpO2 99%  Physical Exam  Nursing note and vitals reviewed. Constitutional: She is oriented to person, place, and time. She appears well-developed and well-nourished. No distress.  HENT:  Head: Normocephalic and atraumatic.  Eyes: Pupils are equal, round, and reactive to light.  Neck: Normal range of motion.  Cardiovascular: Normal rate and intact distal pulses.   Pulmonary/Chest: No respiratory distress.    Pain reproducable to palpation  Abdominal: Normal appearance. She exhibits no distension. There is no tenderness. There is no rebound.  Musculoskeletal: Normal range of motion.  Neurological: She is alert and oriented to person, place, and time. No cranial nerve deficit.  Skin: Skin is warm and dry. No rash noted.  Psychiatric: She has a normal mood and affect. Her behavior is normal.    ED Course  Procedures (including critical care time)  DIAGNOSTIC STUDIES: Oxygen Saturation is 99% on RA, normal by my interpretation.    COORDINATION OF CARE: 8:15 PM- Pt advised of plan for treatment and pt agrees.  Labs Review Labs Reviewed  COMPREHENSIVE METABOLIC PANEL - Abnormal; Notable for the following:    Glucose, Bld 221 (*)    GFR calc non Af Amer 58 (*)    GFR calc Af Amer 67 (*)    All other components within normal limits  PRO B NATRIURETIC PEPTIDE - Abnormal; Notable for the following:    Pro B Natriuretic peptide (BNP) 274.8 (*)    All other components within normal limits  D-DIMER, QUANTITATIVE - Abnormal; Notable for the following:    D-Dimer, Quant 0.74 (*)    All other components within normal limits  CBC  TROPONIN I  TROPONIN I   D Dimer is negative if risk adjusted for age. Imaging Review No results found.  DG Chest Portable 1 View (Final result)  Result time: 06/01/13 20:15:04    Final result by Rad Results In Interface (06/01/13 20:15:04)    Narrative:   CLINICAL DATA: Sudden onset  crushing right chest pain.  EXAM: PORTABLE CHEST - 1 VIEW  COMPARISON: 05/08/2008.  FINDINGS: The cardiac silhouette is mildly enlarged with a mild increase in size. Mild increase in prominence of the pulmonary vasculature. Stable mild prominence of the interstitial markings. No pleural fluid. Unremarkable bones.  IMPRESSION: Mild cardiomegaly and mild pulmonary vascular congestion with mild progression.   Electronically Signed By: Enrique Sack M.D. On: 06/01/2013 20:15     EKG Interpretation   Date/Time:  Saturday Jun 01 2013 19:27:56 EDT  Ventricular Rate:  85 PR Interval:  144 QRS Duration: 82 QT Interval:  358 QTC Calculation: 426 R Axis:   -5 Text Interpretation:  Normal sinus rhythm Minimal voltage criteria for  LVH, may be normal variant Borderline ECG No significant change since last  tracing Confirmed by Meshia Rau  MD, Amarrion Pastorino ([id number]) on 06/01/2013 8:00:15 PM      MDM   Final diagnoses:  Chest wall pain    I personally performed the services described in this documentation, which was scribed in my presence. The recorded information has been reviewed and considered.  Dot Lanes, MD 06/12/13 [phone]

## 2013-06-13 ENCOUNTER — Ambulatory Visit (HOSPITAL_COMMUNITY)
Admission: RE | Admit: 2013-06-13 | Discharge: 2013-06-13 | Disposition: A | Discharge status: Home / Self Care | Payer: Medicare Other | Source: Ambulatory Visit | Attending: Endocrinology | Admitting: Endocrinology

## 2013-06-13 DIAGNOSIS — Z1231 Encounter for screening mammogram for malignant neoplasm of breast: Secondary | ICD-10-CM | POA: Insufficient documentation

## 2014-02-03 DIAGNOSIS — E118 Type 2 diabetes mellitus with unspecified complications: Secondary | ICD-10-CM | POA: Diagnosis not present

## 2014-02-06 DIAGNOSIS — R1030 Lower abdominal pain, unspecified: Secondary | ICD-10-CM | POA: Diagnosis not present

## 2014-02-06 DIAGNOSIS — R771 Abnormality of globulin: Secondary | ICD-10-CM | POA: Diagnosis not present

## 2014-02-06 DIAGNOSIS — K921 Melena: Secondary | ICD-10-CM | POA: Diagnosis not present

## 2014-02-06 DIAGNOSIS — J449 Chronic obstructive pulmonary disease, unspecified: Secondary | ICD-10-CM | POA: Diagnosis not present

## 2014-02-06 DIAGNOSIS — Z23 Encounter for immunization: Secondary | ICD-10-CM | POA: Diagnosis not present

## 2014-02-07 DIAGNOSIS — I517 Cardiomegaly: Secondary | ICD-10-CM | POA: Diagnosis not present

## 2014-02-07 DIAGNOSIS — R918 Other nonspecific abnormal finding of lung field: Secondary | ICD-10-CM | POA: Diagnosis not present

## 2014-02-07 DIAGNOSIS — J449 Chronic obstructive pulmonary disease, unspecified: Secondary | ICD-10-CM | POA: Diagnosis not present

## 2014-02-12 DIAGNOSIS — E118 Type 2 diabetes mellitus with unspecified complications: Secondary | ICD-10-CM | POA: Diagnosis not present

## 2014-02-12 DIAGNOSIS — I10 Essential (primary) hypertension: Secondary | ICD-10-CM | POA: Diagnosis not present

## 2014-03-10 DIAGNOSIS — I10 Essential (primary) hypertension: Secondary | ICD-10-CM | POA: Diagnosis not present

## 2014-03-10 DIAGNOSIS — H4011X1 Primary open-angle glaucoma, mild stage: Secondary | ICD-10-CM | POA: Diagnosis not present

## 2014-03-10 DIAGNOSIS — R1030 Lower abdominal pain, unspecified: Secondary | ICD-10-CM | POA: Diagnosis not present

## 2014-03-10 DIAGNOSIS — K219 Gastro-esophageal reflux disease without esophagitis: Secondary | ICD-10-CM | POA: Diagnosis not present

## 2014-03-10 DIAGNOSIS — H2512 Age-related nuclear cataract, left eye: Secondary | ICD-10-CM | POA: Diagnosis not present

## 2014-03-10 DIAGNOSIS — K921 Melena: Secondary | ICD-10-CM | POA: Diagnosis not present

## 2014-03-10 DIAGNOSIS — J441 Chronic obstructive pulmonary disease with (acute) exacerbation: Secondary | ICD-10-CM | POA: Diagnosis not present

## 2014-03-12 DIAGNOSIS — I10 Essential (primary) hypertension: Secondary | ICD-10-CM | POA: Diagnosis not present

## 2014-03-12 DIAGNOSIS — E118 Type 2 diabetes mellitus with unspecified complications: Secondary | ICD-10-CM | POA: Diagnosis not present

## 2014-03-25 DIAGNOSIS — M150 Primary generalized (osteo)arthritis: Secondary | ICD-10-CM | POA: Diagnosis not present

## 2014-03-25 DIAGNOSIS — M25552 Pain in left hip: Secondary | ICD-10-CM | POA: Diagnosis not present

## 2014-06-05 DIAGNOSIS — I10 Essential (primary) hypertension: Secondary | ICD-10-CM | POA: Diagnosis not present

## 2014-06-05 DIAGNOSIS — E118 Type 2 diabetes mellitus with unspecified complications: Secondary | ICD-10-CM | POA: Diagnosis not present

## 2014-06-16 DIAGNOSIS — H4011X1 Primary open-angle glaucoma, mild stage: Secondary | ICD-10-CM | POA: Diagnosis not present

## 2014-06-16 DIAGNOSIS — E118 Type 2 diabetes mellitus with unspecified complications: Secondary | ICD-10-CM | POA: Diagnosis not present

## 2014-06-16 DIAGNOSIS — H2512 Age-related nuclear cataract, left eye: Secondary | ICD-10-CM | POA: Diagnosis not present

## 2014-06-23 DIAGNOSIS — I10 Essential (primary) hypertension: Secondary | ICD-10-CM | POA: Diagnosis not present

## 2014-06-23 DIAGNOSIS — E118 Type 2 diabetes mellitus with unspecified complications: Secondary | ICD-10-CM | POA: Diagnosis not present

## 2014-06-23 DIAGNOSIS — N390 Urinary tract infection, site not specified: Secondary | ICD-10-CM | POA: Diagnosis not present

## 2014-06-27 ENCOUNTER — Other Ambulatory Visit (HOSPITAL_COMMUNITY): Payer: Self-pay | Admitting: Endocrinology

## 2014-06-27 DIAGNOSIS — Z1231 Encounter for screening mammogram for malignant neoplasm of breast: Secondary | ICD-10-CM

## 2014-07-10 ENCOUNTER — Ambulatory Visit (HOSPITAL_COMMUNITY)
Admission: RE | Admit: 2014-07-10 | Discharge: 2014-07-10 | Disposition: A | Discharge status: Home / Self Care | Payer: Medicare Other | Source: Ambulatory Visit | Attending: Endocrinology | Admitting: Endocrinology

## 2014-07-10 DIAGNOSIS — Z1231 Encounter for screening mammogram for malignant neoplasm of breast: Secondary | ICD-10-CM | POA: Insufficient documentation

## 2014-07-16 DIAGNOSIS — E118 Type 2 diabetes mellitus with unspecified complications: Secondary | ICD-10-CM | POA: Diagnosis not present

## 2014-07-16 DIAGNOSIS — R05 Cough: Secondary | ICD-10-CM | POA: Diagnosis not present

## 2014-07-22 DIAGNOSIS — E118 Type 2 diabetes mellitus with unspecified complications: Secondary | ICD-10-CM | POA: Diagnosis not present

## 2014-09-08 DIAGNOSIS — M7731 Calcaneal spur, right foot: Secondary | ICD-10-CM | POA: Diagnosis not present

## 2014-09-08 DIAGNOSIS — E789 Disorder of lipoprotein metabolism, unspecified: Secondary | ICD-10-CM | POA: Diagnosis not present

## 2014-09-08 DIAGNOSIS — E118 Type 2 diabetes mellitus with unspecified complications: Secondary | ICD-10-CM | POA: Diagnosis not present

## 2014-09-08 DIAGNOSIS — I10 Essential (primary) hypertension: Secondary | ICD-10-CM | POA: Diagnosis not present

## 2014-09-08 DIAGNOSIS — M7989 Other specified soft tissue disorders: Secondary | ICD-10-CM | POA: Diagnosis not present

## 2014-09-08 DIAGNOSIS — M79673 Pain in unspecified foot: Secondary | ICD-10-CM | POA: Diagnosis not present

## 2014-10-09 DIAGNOSIS — E118 Type 2 diabetes mellitus with unspecified complications: Secondary | ICD-10-CM | POA: Diagnosis not present

## 2014-10-16 DIAGNOSIS — R609 Edema, unspecified: Secondary | ICD-10-CM | POA: Diagnosis not present

## 2014-10-16 DIAGNOSIS — I10 Essential (primary) hypertension: Secondary | ICD-10-CM | POA: Diagnosis not present

## 2014-10-16 DIAGNOSIS — Z23 Encounter for immunization: Secondary | ICD-10-CM | POA: Diagnosis not present

## 2014-10-20 DIAGNOSIS — H4011X1 Primary open-angle glaucoma, mild stage: Secondary | ICD-10-CM | POA: Diagnosis not present

## 2014-11-05 DIAGNOSIS — M25662 Stiffness of left knee, not elsewhere classified: Secondary | ICD-10-CM | POA: Diagnosis not present

## 2014-11-05 DIAGNOSIS — R29898 Other symptoms and signs involving the musculoskeletal system: Secondary | ICD-10-CM | POA: Diagnosis not present

## 2014-11-05 DIAGNOSIS — R2689 Other abnormalities of gait and mobility: Secondary | ICD-10-CM | POA: Diagnosis not present

## 2014-11-05 DIAGNOSIS — M25562 Pain in left knee: Secondary | ICD-10-CM | POA: Diagnosis not present

## 2014-11-05 DIAGNOSIS — R293 Abnormal posture: Secondary | ICD-10-CM | POA: Diagnosis not present

## 2014-11-13 DIAGNOSIS — M25662 Stiffness of left knee, not elsewhere classified: Secondary | ICD-10-CM | POA: Diagnosis not present

## 2014-11-13 DIAGNOSIS — M25562 Pain in left knee: Secondary | ICD-10-CM | POA: Diagnosis not present

## 2014-11-13 DIAGNOSIS — R293 Abnormal posture: Secondary | ICD-10-CM | POA: Diagnosis not present

## 2014-11-13 DIAGNOSIS — R29898 Other symptoms and signs involving the musculoskeletal system: Secondary | ICD-10-CM | POA: Diagnosis not present

## 2014-11-13 DIAGNOSIS — R2689 Other abnormalities of gait and mobility: Secondary | ICD-10-CM | POA: Diagnosis not present

## 2014-11-21 DIAGNOSIS — R2689 Other abnormalities of gait and mobility: Secondary | ICD-10-CM | POA: Diagnosis not present

## 2014-11-21 DIAGNOSIS — M25662 Stiffness of left knee, not elsewhere classified: Secondary | ICD-10-CM | POA: Diagnosis not present

## 2014-11-21 DIAGNOSIS — M25562 Pain in left knee: Secondary | ICD-10-CM | POA: Diagnosis not present

## 2014-11-21 DIAGNOSIS — R293 Abnormal posture: Secondary | ICD-10-CM | POA: Diagnosis not present

## 2014-11-21 DIAGNOSIS — R29898 Other symptoms and signs involving the musculoskeletal system: Secondary | ICD-10-CM | POA: Diagnosis not present

## 2014-11-26 DIAGNOSIS — M25662 Stiffness of left knee, not elsewhere classified: Secondary | ICD-10-CM | POA: Diagnosis not present

## 2014-11-26 DIAGNOSIS — R293 Abnormal posture: Secondary | ICD-10-CM | POA: Diagnosis not present

## 2014-11-26 DIAGNOSIS — R29898 Other symptoms and signs involving the musculoskeletal system: Secondary | ICD-10-CM | POA: Diagnosis not present

## 2014-11-26 DIAGNOSIS — M25562 Pain in left knee: Secondary | ICD-10-CM | POA: Diagnosis not present

## 2014-11-26 DIAGNOSIS — R2689 Other abnormalities of gait and mobility: Secondary | ICD-10-CM | POA: Diagnosis not present

## 2014-12-03 DIAGNOSIS — R29898 Other symptoms and signs involving the musculoskeletal system: Secondary | ICD-10-CM | POA: Diagnosis not present

## 2014-12-03 DIAGNOSIS — R2689 Other abnormalities of gait and mobility: Secondary | ICD-10-CM | POA: Diagnosis not present

## 2014-12-03 DIAGNOSIS — M25662 Stiffness of left knee, not elsewhere classified: Secondary | ICD-10-CM | POA: Diagnosis not present

## 2014-12-03 DIAGNOSIS — R293 Abnormal posture: Secondary | ICD-10-CM | POA: Diagnosis not present

## 2014-12-03 DIAGNOSIS — M25562 Pain in left knee: Secondary | ICD-10-CM | POA: Diagnosis not present

## 2014-12-19 DIAGNOSIS — R29898 Other symptoms and signs involving the musculoskeletal system: Secondary | ICD-10-CM | POA: Diagnosis not present

## 2014-12-19 DIAGNOSIS — R2689 Other abnormalities of gait and mobility: Secondary | ICD-10-CM | POA: Diagnosis not present

## 2014-12-19 DIAGNOSIS — R293 Abnormal posture: Secondary | ICD-10-CM | POA: Diagnosis not present

## 2014-12-19 DIAGNOSIS — M25662 Stiffness of left knee, not elsewhere classified: Secondary | ICD-10-CM | POA: Diagnosis not present

## 2014-12-19 DIAGNOSIS — M25562 Pain in left knee: Secondary | ICD-10-CM | POA: Diagnosis not present

## 2014-12-19 DIAGNOSIS — G8929 Other chronic pain: Secondary | ICD-10-CM | POA: Diagnosis not present

## 2014-12-31 DIAGNOSIS — G8929 Other chronic pain: Secondary | ICD-10-CM | POA: Diagnosis not present

## 2014-12-31 DIAGNOSIS — R293 Abnormal posture: Secondary | ICD-10-CM | POA: Diagnosis not present

## 2014-12-31 DIAGNOSIS — M25562 Pain in left knee: Secondary | ICD-10-CM | POA: Diagnosis not present

## 2014-12-31 DIAGNOSIS — R2689 Other abnormalities of gait and mobility: Secondary | ICD-10-CM | POA: Diagnosis not present

## 2014-12-31 DIAGNOSIS — M25662 Stiffness of left knee, not elsewhere classified: Secondary | ICD-10-CM | POA: Diagnosis not present

## 2014-12-31 DIAGNOSIS — R29898 Other symptoms and signs involving the musculoskeletal system: Secondary | ICD-10-CM | POA: Diagnosis not present

## 2015-01-01 DIAGNOSIS — T50B95A Adverse effect of other viral vaccines, initial encounter: Secondary | ICD-10-CM | POA: Diagnosis not present

## 2015-01-08 DIAGNOSIS — I10 Essential (primary) hypertension: Secondary | ICD-10-CM | POA: Diagnosis not present

## 2015-01-08 DIAGNOSIS — E118 Type 2 diabetes mellitus with unspecified complications: Secondary | ICD-10-CM | POA: Diagnosis not present

## 2015-01-16 DIAGNOSIS — E118 Type 2 diabetes mellitus with unspecified complications: Secondary | ICD-10-CM | POA: Diagnosis not present

## 2015-01-16 DIAGNOSIS — I10 Essential (primary) hypertension: Secondary | ICD-10-CM | POA: Diagnosis not present

## 2015-01-28 DIAGNOSIS — M6281 Muscle weakness (generalized): Secondary | ICD-10-CM | POA: Diagnosis not present

## 2015-01-28 DIAGNOSIS — G8929 Other chronic pain: Secondary | ICD-10-CM | POA: Diagnosis not present

## 2015-01-28 DIAGNOSIS — M25562 Pain in left knee: Secondary | ICD-10-CM | POA: Diagnosis not present

## 2015-03-08 ENCOUNTER — Encounter (HOSPITAL_BASED_OUTPATIENT_CLINIC_OR_DEPARTMENT_OTHER): Payer: Self-pay | Admitting: *Deleted

## 2015-03-08 ENCOUNTER — Emergency Department (HOSPITAL_BASED_OUTPATIENT_CLINIC_OR_DEPARTMENT_OTHER)
Admission: EM | Admit: 2015-03-08 | Discharge: 2015-03-08 | Disposition: A | Discharge status: Home / Self Care | Payer: Medicare Other | Attending: Emergency Medicine | Admitting: Emergency Medicine

## 2015-03-08 DIAGNOSIS — K219 Gastro-esophageal reflux disease without esophagitis: Secondary | ICD-10-CM | POA: Insufficient documentation

## 2015-03-08 DIAGNOSIS — I10 Essential (primary) hypertension: Secondary | ICD-10-CM | POA: Diagnosis not present

## 2015-03-08 DIAGNOSIS — K625 Hemorrhage of anus and rectum: Secondary | ICD-10-CM | POA: Insufficient documentation

## 2015-03-08 DIAGNOSIS — E119 Type 2 diabetes mellitus without complications: Secondary | ICD-10-CM | POA: Diagnosis not present

## 2015-03-08 DIAGNOSIS — Z90710 Acquired absence of both cervix and uterus: Secondary | ICD-10-CM | POA: Diagnosis not present

## 2015-03-08 DIAGNOSIS — Z7982 Long term (current) use of aspirin: Secondary | ICD-10-CM | POA: Insufficient documentation

## 2015-03-08 DIAGNOSIS — H409 Unspecified glaucoma: Secondary | ICD-10-CM | POA: Diagnosis not present

## 2015-03-08 DIAGNOSIS — Z791 Long term (current) use of non-steroidal anti-inflammatories (NSAID): Secondary | ICD-10-CM | POA: Insufficient documentation

## 2015-03-08 DIAGNOSIS — K644 Residual hemorrhoidal skin tags: Secondary | ICD-10-CM | POA: Diagnosis not present

## 2015-03-08 DIAGNOSIS — E7800 Pure hypercholesterolemia, unspecified: Secondary | ICD-10-CM | POA: Insufficient documentation

## 2015-03-08 DIAGNOSIS — Z7984 Long term (current) use of oral hypoglycemic drugs: Secondary | ICD-10-CM | POA: Diagnosis not present

## 2015-03-08 DIAGNOSIS — E669 Obesity, unspecified: Secondary | ICD-10-CM | POA: Insufficient documentation

## 2015-03-08 DIAGNOSIS — Z79899 Other long term (current) drug therapy: Secondary | ICD-10-CM | POA: Insufficient documentation

## 2015-03-08 DIAGNOSIS — K921 Melena: Secondary | ICD-10-CM | POA: Diagnosis present

## 2015-03-08 LAB — BASIC METABOLIC PANEL
Anion gap: 8 (ref 5–15)
BUN: 30 mg/dL — ABNORMAL HIGH (ref 6–20)
CO2: 26 mmol/L (ref 22–32)
Calcium: 9.9 mg/dL (ref 8.9–10.3)
Chloride: 105 mmol/L (ref 101–111)
Creatinine, Ser: 1.28 mg/dL — ABNORMAL HIGH (ref 0.44–1.00)
GFR calc Af Amer: 49 mL/min — ABNORMAL LOW (ref >60)
GFR calc non Af Amer: 42 mL/min — ABNORMAL LOW (ref >60)
Glucose, Bld: 291 mg/dL — ABNORMAL HIGH (ref 65–99)
Potassium: 4.6 mmol/L (ref 3.5–5.1)
Sodium: 139 mmol/L (ref 135–145)

## 2015-03-08 LAB — CBC WITH DIFFERENTIAL/PLATELET
Basophils Absolute: 0 10*3/uL (ref 0.0–0.1)
Basophils Relative: 0 %
Eosinophils Absolute: 0.3 10*3/uL (ref 0.0–0.7)
Eosinophils Relative: 5 %
HCT: 39.8 % (ref 36.0–46.0)
Hemoglobin: 12.8 g/dL (ref 12.0–15.0)
Lymphocytes Relative: 25 %
Lymphs Abs: 1.3 10*3/uL (ref 0.7–4.0)
MCH: 28.3 pg (ref 26.0–34.0)
MCHC: 32.2 g/dL (ref 30.0–36.0)
MCV: 87.9 fL (ref 78.0–100.0)
Monocytes Absolute: 0.7 10*3/uL (ref 0.1–1.0)
Monocytes Relative: 14 %
Neutro Abs: 2.9 10*3/uL (ref 1.7–7.7)
Neutrophils Relative %: 56 %
Platelets: 191 10*3/uL (ref 150–400)
RBC: 4.53 MIL/uL (ref 3.87–5.11)
RDW: 13.7 % (ref 11.5–15.5)
WBC: 5.1 10*3/uL (ref 4.0–10.5)

## 2015-03-08 LAB — OCCULT BLOOD X 1 CARD TO LAB, STOOL: Fecal Occult Bld: POSITIVE — AB

## 2015-03-08 MED ORDER — HYDROCORTISONE 2.5 % RE CREA: TOPICAL_CREAM | RECTAL | Status: DC

## 2015-03-08 MED ORDER — POLYETHYLENE GLYCOL 3350 17 G PO PACK: 17.0000 g | PACK | Freq: Every day | ORAL | Status: DC

## 2015-03-08 NOTE — ED Notes (Signed)
States awoke this am and noted when had BM this am, tissue had bright red blood

## 2015-03-08 NOTE — ED Provider Notes (Signed)
CSN: [id number]     Arrival date & time 03/08/15  0920 History   First MD Initiated Contact with Patient 03/08/15 [phone]     No chief complaint on file.    (Consider location/radiation/quality/duration/timing/severity/associated sxs/prior Treatment) HPI   70 year old obese female with history of hypertension, diabetes, GERD who presents for evaluation of bright red blood per rectum. Patient states 2 days ago she was constipated and subsequently had a bowel movement that has small amount of blood mixed with the stool. Denies having any significant pain at that time. Symptoms seems to resolve until today when she had a normal bowel movement and also notice additional blood mixed with stools and blood on her toilet paper. She quantify as half a teaspoon. She denies any rectal pain or rectal itchiness. No associated fever, chills, lightheadedness, dizziness, chest pain, abdominal pain or nausea vomiting.  Patient mentioned that she has been trying to go to the gym to stay active which causes increasing muscle pain to her hips and knees. She has been taking Mowbray to alleviate her pain and take a dose every 3 days with last dose last night. She is not on any other blood thinning medication aside from a baby aspirin. Remote history of diverticulosis. She denies having any history of active cancer. She has a colonoscopy performed in 2014 and states there was several benign polyps. Patient has no other complaint.  Past Medical History  Diagnosis Date  . GERD (gastroesophageal reflux disease)   . Hypertension   . Diabetes mellitus   . Hypercholesteremia   . Glaucoma   . Obesity   . Glaucoma    Past Surgical History  Procedure Laterality Date  . Abdominal hysterectomy    . Tonsillectomy    . Eye surgery     Family History  Problem Relation Age of Onset  . Cancer Other   . Hyperlipidemia Other    Social History  Substance Use Topics  . Smoking status: Never Smoker   . Smokeless tobacco: Not on  file  . Alcohol Use: No   OB History    No data available     Review of Systems  All other systems reviewed and are negative.     Allergies  Hydrocodone-acetaminophen and Sulfonamide derivatives  Home Medications   Prior to Admission medications   Medication Sig Start Date End Date Taking? Authorizing Provider  amLODipine-valsartan (EXFORGE) 10-320 MG per tablet Take 1 tablet by mouth daily.      Historical Provider, MD  aspirin EC 81 MG tablet Take 81 mg by mouth daily.      Historical Provider, MD  atorvastatin (LIPITOR) 20 MG tablet Take 20 mg by mouth daily.      Historical Provider, MD  bimatoprost (LUMIGAN) 0.03 % ophthalmic solution Place 1 drop into both eyes at bedtime.      Historical Provider, MD  brimonidine-timolol (COMBIGAN) 0.2-0.5 % ophthalmic solution Place 1 drop into both eyes every 12 (twelve) hours.      Historical Provider, MD  brinzolamide (AZOPT) 1 % ophthalmic suspension Place 1 drop into both eyes 3 (three) times daily.      Historical Provider, MD  cyclobenzaprine (FLEXERIL) 5 MG tablet Take 1 tablet (5 mg total) by mouth 3 (three) times daily as needed for muscle spasms. 06/01/13   Leonard Schwartz, MD  esomeprazole (NEXIUM) 40 MG capsule Take 40 mg by mouth daily as needed. Acid reflux     Historical Provider, MD  glipiZIDE (GLUCOTROL XL) 5 MG  24 hr tablet Take 5 mg by mouth daily.      Historical Provider, MD  ibuprofen (ADVIL,MOTRIN) 600 MG tablet Take 1 tablet (600 mg total) by mouth every 6 (six) hours as needed. 06/01/13   Leonard Schwartz, MD  meloxicam (MOBIC) 15 MG tablet Take 15 mg by mouth daily as needed. pain     Historical Provider, MD  metFORMIN (GLUCOPHAGE) 500 MG tablet Take 500 mg by mouth 2 (two) times daily with a meal.      Historical Provider, MD  nebivolol (BYSTOLIC) 5 MG tablet Take 5 mg by mouth daily.    Historical Provider, MD  oxyCODONE-acetaminophen (PERCOCET/ROXICET) 5-325 MG per tablet Take 1 tablet by mouth every 6 (six) hours as  needed for severe pain. 04/02/13   Blanchie Dessert, MD  vitamin E 400 UNIT capsule Take 400 Units by mouth daily.      Historical Provider, MD   BP 166/93 mmHg  Pulse 79  Temp(Src) 98.2 F (36.8 C) (Oral)  Resp 18  Ht 5\' 3"  (1.6 m)  Wt 108.863 kg  BMI 42.52 kg/m2  SpO2 100% Physical Exam  Constitutional: She appears well-developed and well-nourished. No distress.  Obese F in [place] female appears to be in no acute distress.  HENT:  Head: Atraumatic.  Eyes: Conjunctivae are normal.  Neck: Neck supple.  Cardiovascular: Normal rate and regular rhythm.   Pulmonary/Chest: Effort normal and breath sounds normal.  Abdominal: Soft. Bowel sounds are normal. She exhibits no distension. There is no tenderness.  Genitourinary:  Chaperone present during exam. Evidence of external hemorrhoid, nonthrombosed. Normal rectal tone. No obvious mass appreciated on digital rectal exam. Maroon colored stool noted on glove, Hemoccult-positive. No anal fissure, no significant pain or discomfort on exam.  Neurological: She is alert.  Skin: No rash noted.  Psychiatric: She has a normal mood and affect.  Nursing note and vitals reviewed.   ED Course  Procedures (including critical care time) Labs Review Labs Reviewed  OCCULT BLOOD X 1 CARD TO LAB, STOOL - Abnormal; Notable for the following:    Fecal Occult Bld POSITIVE (*)    All other components within normal limits  BASIC METABOLIC PANEL - Abnormal; Notable for the following:    Glucose, Bld 291 (*)    BUN 30 (*)    Creatinine, Ser 1.28 (*)    GFR calc non Af Amer 42 (*)    GFR calc Af Amer 49 (*)    All other components within normal limits  CBC WITH DIFFERENTIAL/PLATELET  POC OCCULT BLOOD, ED    Imaging Review No results found. I have personally reviewed and evaluated these images and lab results as part of my medical decision-making.   EKG Interpretation None      MDM   Final diagnoses:  Bright red rectal bleeding    BP  166/93 mmHg  Pulse 79  Temp(Src) 98.2 F (36.8 C) (Oral)  Resp 18  Ht 5\' 3"  (1.6 m)  Wt 108.863 kg  BMI 42.52 kg/m2  SpO2 100%   9:53 AM Elderly female here with bright red blood per rectum. This likely secondary to her hemorrhoids from a recent bouts of constipation several days ago. Patient has been taking antiinflammatory medication which may irritate her GI lining. Patient does not have any significant abdominal discomfort, diarrhea, or fever to suggest diverticulitis. Will check basic labs.  Care discussed with Dr. Jeneen Rinks.   10:38 AM Fecal occult blood is positive. Patient has normal H&H. Her labs  otherwise reassuring. Evidence of renal insufficiency with BUN 30, creatinine 1.28 and a GFR 42 which is mildly increased from prior. I encourage patient to avoid using Mobic or other NSAIDs at this time as it can worsen her kidney function. Encourage staying hydrated. I will prescribe Anusol, stool softener, and encouraged patient to follow-up with GI specialist probably for further management. Patient made aware to return if she develops persistent rectal bleeding or she has any other concern.   Domenic Moras, PA-C 03/08/15 [place], MD 03/11/15 [phone]

## 2015-03-08 NOTE — Discharge Instructions (Signed)
Please follow up with your GI specialist for further evaluation of your rectal bleeding.  Return if you developed persistent bleeding or if you have other concerns.  Gastrointestinal Bleeding Gastrointestinal (GI) bleeding means there is bleeding somewhere along the digestive tract, between the mouth and anus. CAUSES  There are many different problems that can cause GI bleeding. Possible causes include:  Esophagitis. This is inflammation, irritation, or swelling of the esophagus.  Hemorrhoids.These are veins that are full of blood (engorged) in the rectum. They cause pain, inflammation, and may bleed.  Anal fissures.These are areas of painful tearing which may bleed. They are often caused by passing hard stool.  Diverticulosis.These are pouches that form on the colon over time, with age, and may bleed significantly.  Diverticulitis.This is inflammation in areas with diverticulosis. It can cause pain, fever, and bloody stools, although bleeding is rare.  Polyps and cancer. Colon cancer often starts out as precancerous polyps.  Gastritis and ulcers.Bleeding from the upper gastrointestinal tract (near the stomach) may travel through the intestines and produce black, sometimes tarry, often bad smelling stools. In certain cases, if the bleeding is fast enough, the stools may not be black, but red. This condition may be life-threatening. SYMPTOMS   Vomiting bright red blood or material that looks like coffee grounds.  Bloody, black, or tarry stools. DIAGNOSIS  Your caregiver may diagnose your condition by taking your history and performing a physical exam. More tests may be needed, including:  X-rays and other imaging tests.  Esophagogastroduodenoscopy (EGD). This test uses a flexible, lighted tube to look at your esophagus, stomach, and small intestine.  Colonoscopy. This test uses a flexible, lighted tube to look at your colon. TREATMENT  Treatment depends on the cause of your  bleeding.   For bleeding from the esophagus, stomach, small intestine, or colon, the caregiver doing your EGD or colonoscopy may be able to stop the bleeding as part of the procedure.  Inflammation or infection of the colon can be treated with medicines.  Many rectal problems can be treated with creams, suppositories, or warm baths.  Surgery is sometimes needed.  Blood transfusions are sometimes needed if you have lost a lot of blood. If bleeding is slow, you may be allowed to go home. If there is a lot of bleeding, you will need to stay in the hospital for observation. HOME CARE INSTRUCTIONS   Take any medicines exactly as prescribed.  Keep your stools soft by eating foods that are high in fiber. These foods include whole grains, legumes, fruits, and vegetables. Prunes (1 to 3 a day) work well for many people.  Drink enough fluids to keep your urine clear or pale yellow. SEEK IMMEDIATE MEDICAL CARE IF:   Your bleeding increases.  You feel lightheaded, weak, or you faint.  You have severe cramps in your back or abdomen.  You pass large blood clots in your stool.  Your problems are getting worse. MAKE SURE YOU:   Understand these instructions.  Will watch your condition.  Will get help right away if you are not doing well or get worse.   This information is not intended to replace advice given to you by your health care provider. Make sure you discuss any questions you have with your health care provider.   Document Released: 01/15/2000 Document Revised: 01/04/2012 Document Reviewed: 07/07/2014 Elsevier Interactive Patient Education [employer].

## 2015-03-08 NOTE — ED Notes (Signed)
MD at bedside. 

## 2015-03-08 NOTE — ED Notes (Signed)
States has hx of being treated for diverticulitis

## 2015-03-08 NOTE — ED Notes (Signed)
Hemoccult card  at bedside 

## 2015-03-08 NOTE — ED Notes (Signed)
States had noticed this a few days ago when she was constipated.

## 2015-08-12 ENCOUNTER — Other Ambulatory Visit: Payer: Self-pay | Admitting: Endocrinology

## 2015-08-12 DIAGNOSIS — Z1231 Encounter for screening mammogram for malignant neoplasm of breast: Secondary | ICD-10-CM

## 2015-08-25 ENCOUNTER — Ambulatory Visit
Admission: RE | Admit: 2015-08-25 | Discharge: 2015-08-25 | Disposition: A | Discharge status: Home / Self Care | Payer: Medicare Other | Source: Ambulatory Visit | Attending: Endocrinology | Admitting: Endocrinology

## 2015-08-25 DIAGNOSIS — Z1231 Encounter for screening mammogram for malignant neoplasm of breast: Secondary | ICD-10-CM

## 2015-10-02 DIAGNOSIS — L02611 Cutaneous abscess of right foot: Secondary | ICD-10-CM | POA: Diagnosis not present

## 2015-10-02 DIAGNOSIS — E119 Type 2 diabetes mellitus without complications: Secondary | ICD-10-CM | POA: Diagnosis not present

## 2015-10-02 DIAGNOSIS — M79674 Pain in right toe(s): Secondary | ICD-10-CM | POA: Diagnosis not present

## 2015-10-02 DIAGNOSIS — L602 Onychogryphosis: Secondary | ICD-10-CM | POA: Diagnosis not present

## 2015-10-02 DIAGNOSIS — L03031 Cellulitis of right toe: Secondary | ICD-10-CM | POA: Diagnosis not present

## 2015-10-07 DIAGNOSIS — I10 Essential (primary) hypertension: Secondary | ICD-10-CM | POA: Diagnosis not present

## 2015-10-07 DIAGNOSIS — E118 Type 2 diabetes mellitus with unspecified complications: Secondary | ICD-10-CM | POA: Diagnosis not present

## 2015-10-07 DIAGNOSIS — Z23 Encounter for immunization: Secondary | ICD-10-CM | POA: Diagnosis not present

## 2015-10-13 DIAGNOSIS — E1351 Other specified diabetes mellitus with diabetic peripheral angiopathy without gangrene: Secondary | ICD-10-CM | POA: Diagnosis not present

## 2015-10-13 DIAGNOSIS — L602 Onychogryphosis: Secondary | ICD-10-CM | POA: Diagnosis not present

## 2015-10-16 DIAGNOSIS — Z4502 Encounter for adjustment and management of automatic implantable cardiac defibrillator: Secondary | ICD-10-CM | POA: Diagnosis not present

## 2015-10-16 DIAGNOSIS — E7800 Pure hypercholesterolemia, unspecified: Secondary | ICD-10-CM | POA: Diagnosis not present

## 2015-10-16 DIAGNOSIS — I472 Ventricular tachycardia: Secondary | ICD-10-CM | POA: Diagnosis not present

## 2015-10-16 DIAGNOSIS — I2510 Atherosclerotic heart disease of native coronary artery without angina pectoris: Secondary | ICD-10-CM | POA: Diagnosis not present

## 2015-10-16 DIAGNOSIS — I10 Essential (primary) hypertension: Secondary | ICD-10-CM | POA: Diagnosis not present

## 2015-10-16 DIAGNOSIS — K219 Gastro-esophageal reflux disease without esophagitis: Secondary | ICD-10-CM | POA: Diagnosis not present

## 2015-10-16 DIAGNOSIS — Z95810 Presence of automatic (implantable) cardiac defibrillator: Secondary | ICD-10-CM | POA: Diagnosis not present

## 2015-10-23 DIAGNOSIS — J449 Chronic obstructive pulmonary disease, unspecified: Secondary | ICD-10-CM | POA: Diagnosis not present

## 2015-10-23 DIAGNOSIS — E7800 Pure hypercholesterolemia, unspecified: Secondary | ICD-10-CM | POA: Diagnosis not present

## 2015-11-12 DIAGNOSIS — H401131 Primary open-angle glaucoma, bilateral, mild stage: Secondary | ICD-10-CM | POA: Diagnosis not present

## 2015-11-12 DIAGNOSIS — H2512 Age-related nuclear cataract, left eye: Secondary | ICD-10-CM | POA: Diagnosis not present

## 2015-12-14 DIAGNOSIS — L602 Onychogryphosis: Secondary | ICD-10-CM | POA: Diagnosis not present

## 2015-12-16 DIAGNOSIS — E118 Type 2 diabetes mellitus with unspecified complications: Secondary | ICD-10-CM | POA: Diagnosis not present

## 2015-12-16 DIAGNOSIS — I10 Essential (primary) hypertension: Secondary | ICD-10-CM | POA: Diagnosis not present

## 2015-12-17 DIAGNOSIS — I10 Essential (primary) hypertension: Secondary | ICD-10-CM | POA: Diagnosis not present

## 2015-12-17 DIAGNOSIS — E118 Type 2 diabetes mellitus with unspecified complications: Secondary | ICD-10-CM | POA: Diagnosis not present

## 2015-12-31 DIAGNOSIS — E118 Type 2 diabetes mellitus with unspecified complications: Secondary | ICD-10-CM | POA: Diagnosis not present

## 2015-12-31 DIAGNOSIS — E789 Disorder of lipoprotein metabolism, unspecified: Secondary | ICD-10-CM | POA: Diagnosis not present

## 2015-12-31 DIAGNOSIS — Z23 Encounter for immunization: Secondary | ICD-10-CM | POA: Diagnosis not present

## 2015-12-31 DIAGNOSIS — I10 Essential (primary) hypertension: Secondary | ICD-10-CM | POA: Diagnosis not present

## 2016-01-08 DIAGNOSIS — I10 Essential (primary) hypertension: Secondary | ICD-10-CM | POA: Diagnosis not present

## 2016-01-12 DIAGNOSIS — E118 Type 2 diabetes mellitus with unspecified complications: Secondary | ICD-10-CM | POA: Diagnosis not present

## 2016-02-07 DIAGNOSIS — R091 Pleurisy: Secondary | ICD-10-CM | POA: Diagnosis not present

## 2016-02-07 DIAGNOSIS — R0789 Other chest pain: Secondary | ICD-10-CM | POA: Diagnosis not present

## 2016-02-07 DIAGNOSIS — J209 Acute bronchitis, unspecified: Secondary | ICD-10-CM | POA: Diagnosis not present

## 2016-02-25 DIAGNOSIS — M25519 Pain in unspecified shoulder: Secondary | ICD-10-CM | POA: Diagnosis not present

## 2016-02-25 DIAGNOSIS — E118 Type 2 diabetes mellitus with unspecified complications: Secondary | ICD-10-CM | POA: Diagnosis not present

## 2016-02-25 DIAGNOSIS — I10 Essential (primary) hypertension: Secondary | ICD-10-CM | POA: Diagnosis not present

## 2016-02-25 DIAGNOSIS — M19011 Primary osteoarthritis, right shoulder: Secondary | ICD-10-CM | POA: Diagnosis not present

## 2016-02-25 DIAGNOSIS — R030 Elevated blood-pressure reading, without diagnosis of hypertension: Secondary | ICD-10-CM | POA: Diagnosis not present

## 2016-03-02 DIAGNOSIS — E118 Type 2 diabetes mellitus with unspecified complications: Secondary | ICD-10-CM | POA: Diagnosis not present

## 2016-03-02 DIAGNOSIS — I10 Essential (primary) hypertension: Secondary | ICD-10-CM | POA: Diagnosis not present

## 2016-03-04 DIAGNOSIS — H401131 Primary open-angle glaucoma, bilateral, mild stage: Secondary | ICD-10-CM | POA: Diagnosis not present

## 2016-03-07 ENCOUNTER — Encounter: Payer: Self-pay | Admitting: Gastroenterology

## 2016-03-08 DIAGNOSIS — M25551 Pain in right hip: Secondary | ICD-10-CM | POA: Diagnosis not present

## 2016-03-08 DIAGNOSIS — M25511 Pain in right shoulder: Secondary | ICD-10-CM | POA: Diagnosis not present

## 2016-03-08 DIAGNOSIS — M150 Primary generalized (osteo)arthritis: Secondary | ICD-10-CM | POA: Diagnosis not present

## 2016-03-08 DIAGNOSIS — M7581 Other shoulder lesions, right shoulder: Secondary | ICD-10-CM | POA: Diagnosis not present

## 2016-03-21 DIAGNOSIS — R1032 Left lower quadrant pain: Secondary | ICD-10-CM | POA: Diagnosis not present

## 2016-03-21 DIAGNOSIS — K8020 Calculus of gallbladder without cholecystitis without obstruction: Secondary | ICD-10-CM | POA: Diagnosis not present

## 2016-03-21 DIAGNOSIS — I10 Essential (primary) hypertension: Secondary | ICD-10-CM | POA: Diagnosis not present

## 2016-03-21 DIAGNOSIS — K5730 Diverticulosis of large intestine without perforation or abscess without bleeding: Secondary | ICD-10-CM | POA: Diagnosis not present

## 2016-04-06 DIAGNOSIS — E118 Type 2 diabetes mellitus with unspecified complications: Secondary | ICD-10-CM | POA: Diagnosis not present

## 2016-04-06 DIAGNOSIS — I10 Essential (primary) hypertension: Secondary | ICD-10-CM | POA: Diagnosis not present

## 2016-04-06 DIAGNOSIS — R0602 Shortness of breath: Secondary | ICD-10-CM | POA: Diagnosis not present

## 2016-04-19 DIAGNOSIS — G629 Polyneuropathy, unspecified: Secondary | ICD-10-CM | POA: Diagnosis not present

## 2016-04-19 DIAGNOSIS — E118 Type 2 diabetes mellitus with unspecified complications: Secondary | ICD-10-CM | POA: Diagnosis not present

## 2016-04-19 DIAGNOSIS — M79606 Pain in leg, unspecified: Secondary | ICD-10-CM | POA: Diagnosis not present

## 2016-04-19 DIAGNOSIS — M47816 Spondylosis without myelopathy or radiculopathy, lumbar region: Secondary | ICD-10-CM | POA: Diagnosis not present

## 2016-04-19 DIAGNOSIS — I10 Essential (primary) hypertension: Secondary | ICD-10-CM | POA: Diagnosis not present

## 2016-05-03 ENCOUNTER — Encounter: Payer: Self-pay | Admitting: Podiatry

## 2016-05-03 ENCOUNTER — Ambulatory Visit (INDEPENDENT_AMBULATORY_CARE_PROVIDER_SITE_OTHER): Payer: Medicare Other | Admitting: Podiatry

## 2016-05-03 DIAGNOSIS — M79676 Pain in unspecified toe(s): Secondary | ICD-10-CM

## 2016-05-03 DIAGNOSIS — B351 Tinea unguium: Secondary | ICD-10-CM

## 2016-05-03 DIAGNOSIS — E119 Type 2 diabetes mellitus without complications: Secondary | ICD-10-CM | POA: Diagnosis not present

## 2016-05-03 DIAGNOSIS — M76822 Posterior tibial tendinitis, left leg: Secondary | ICD-10-CM

## 2016-05-03 DIAGNOSIS — I10 Essential (primary) hypertension: Secondary | ICD-10-CM | POA: Diagnosis not present

## 2016-05-03 DIAGNOSIS — M2142 Flat foot [pes planus] (acquired), left foot: Secondary | ICD-10-CM | POA: Diagnosis not present

## 2016-05-03 DIAGNOSIS — E118 Type 2 diabetes mellitus with unspecified complications: Secondary | ICD-10-CM | POA: Diagnosis not present

## 2016-05-04 NOTE — Progress Notes (Signed)
   Subjective:    Patient ID: [name], female    DOB: 1945/12/12, 71 y.o.   MRN: [id number]  HPI this patient presents the office with chief complaint of painful long thick nails. She says the nails are painful walking and wearing her shoes. She does have a history of diabetes mellitus. She also set says she has problems with her left rear foot for which she wears special shoes. She presents the office today for an evaluation of her foot as well as nail care    Review of Systems  All other systems reviewed and are negative.      Objective:   Physical Exam GENERAL APPEARANCE: Alert, conversant. Appropriately groomed. No acute distress.  VASCULAR: Pedal pulses are  palpable at  [facility] and PT bilateral.  Capillary refill time is immediate to all digits,  Normal temperature gradient.  Digital hair growth is present bilateral  NEUROLOGIC: sensation is normal to 5.07 monofilament at 5/5 sites bilateral.  Light touch is intact bilateral, Muscle strength normal.  MUSCULOSKELETAL: acceptable muscle strength, tone and stability bilateral.  Intrinsic muscluature intact bilateral.  Rectus appearance of foot and digits noted bilateral. Limited ROM STJ left foot.  DERMATOLOGIC: skin color, texture, and turgor are within normal limits.  No preulcerative lesions or ulcers  are seen, no interdigital maceration noted.  No open lesions present.  . No drainage noted.  NAILS  Thick disfigured discolored nails both feet.         Assessment & Plan:  Pain due to onychomycosis  DM  PTTD   IE  Debridement of nails.  Told patient that she should reappoint herself with me for me to set her up with an appointment with the assist for her posterior tibial tendon dysfunction   Gardiner Barefoot DPM

## 2016-05-19 DIAGNOSIS — H401131 Primary open-angle glaucoma, bilateral, mild stage: Secondary | ICD-10-CM | POA: Diagnosis not present

## 2016-05-31 ENCOUNTER — Other Ambulatory Visit: Payer: Medicare Other | Admitting: Orthotics

## 2016-07-05 ENCOUNTER — Ambulatory Visit: Payer: Medicare Other | Admitting: Orthotics

## 2016-07-05 DIAGNOSIS — M79676 Pain in unspecified toe(s): Secondary | ICD-10-CM

## 2016-07-05 NOTE — Progress Notes (Signed)
Resubmitting paperwork for diabetic shoes....verifying with Dr. Prudence Davidson that she is eligible.Marland Kitchen

## 2016-07-07 DIAGNOSIS — E118 Type 2 diabetes mellitus with unspecified complications: Secondary | ICD-10-CM | POA: Diagnosis not present

## 2016-07-07 DIAGNOSIS — Z79899 Other long term (current) drug therapy: Secondary | ICD-10-CM | POA: Diagnosis not present

## 2016-07-07 DIAGNOSIS — I10 Essential (primary) hypertension: Secondary | ICD-10-CM | POA: Diagnosis not present

## 2016-07-07 DIAGNOSIS — M25559 Pain in unspecified hip: Secondary | ICD-10-CM | POA: Diagnosis not present

## 2016-07-07 DIAGNOSIS — M1612 Unilateral primary osteoarthritis, left hip: Secondary | ICD-10-CM | POA: Diagnosis not present

## 2016-07-07 DIAGNOSIS — M1712 Unilateral primary osteoarthritis, left knee: Secondary | ICD-10-CM | POA: Diagnosis not present

## 2016-07-14 DIAGNOSIS — I10 Essential (primary) hypertension: Secondary | ICD-10-CM | POA: Diagnosis not present

## 2016-07-14 DIAGNOSIS — E118 Type 2 diabetes mellitus with unspecified complications: Secondary | ICD-10-CM | POA: Diagnosis not present

## 2016-08-04 ENCOUNTER — Encounter: Payer: Self-pay | Admitting: Podiatry

## 2016-08-04 ENCOUNTER — Ambulatory Visit (INDEPENDENT_AMBULATORY_CARE_PROVIDER_SITE_OTHER): Payer: Medicare Other | Admitting: Podiatry

## 2016-08-04 DIAGNOSIS — E1142 Type 2 diabetes mellitus with diabetic polyneuropathy: Secondary | ICD-10-CM

## 2016-08-04 DIAGNOSIS — M76822 Posterior tibial tendinitis, left leg: Secondary | ICD-10-CM | POA: Diagnosis not present

## 2016-08-04 DIAGNOSIS — B351 Tinea unguium: Secondary | ICD-10-CM

## 2016-08-04 DIAGNOSIS — M79676 Pain in unspecified toe(s): Secondary | ICD-10-CM

## 2016-08-04 DIAGNOSIS — M2142 Flat foot [pes planus] (acquired), left foot: Secondary | ICD-10-CM | POA: Diagnosis not present

## 2016-08-04 DIAGNOSIS — M2141 Flat foot [pes planus] (acquired), right foot: Secondary | ICD-10-CM | POA: Diagnosis not present

## 2016-08-04 NOTE — Progress Notes (Addendum)
   Subjective:    Patient ID: [name], female    DOB: Mar 25, 1945, 71 y.o.   MRN: [id number]  HPI this patient presents the office with chief complaint of painful long thick nails. She says the nails are painful walking and wearing her shoes. She does have a history of diabetes mellitus. She also says she wants to check on her diabetic shoes.      Review of Systems  All other systems reviewed and are negative.      Objective:   Physical Exam GENERAL APPEARANCE: Alert, conversant. Appropriately groomed. No acute distress.  VASCULAR: Pedal pulses are  palpable at  [facility] and PT bilateral.  Capillary refill time is immediate to all digits,  Normal temperature gradient.  Digital hair growth is present bilateral  NEUROLOGIC: sensation is diminished  to 5.07 monofilament at 5/5 sites bilateral.  Light touch is intact bilateral, Muscle strength normal.  MUSCULOSKELETAL: acceptable muscle strength, tone and stability bilateral.  Intrinsic muscluature intact bilateral.  Rectus appearance of foot and digits noted bilateral. Limited ROM STJ left foot.  DERMATOLOGIC: skin color, texture, and turgor are within normal limits.  No preulcerative lesions or ulcers  are seen, no interdigital maceration noted.  No open lesions present.  . No drainage noted.  NAILS  Thick disfigured discolored nails both feet.         Assessment & Plan:  Pain due to onychomycosis  DM  PTTD   IE  Debridement of nails.  Patient to receive diabetic shoes from [place]. Patient presents today and was dispensed 0ne pair ( two units) of medically necessary extra depth shoes with three pair( six units) of custom molded multiple density inserts. The shoes and the inserts are fitted to the patients ' feet and are noted to fit well and are free of defect.  Length and width of the shoes are also acceptable.  Patient was given written and verbal  instructions for wearing.  If any concerns arrive with the shoes or inserts, the  patient is to call the office.Patient is to follow up with doctor in six weeks.    Gardiner Barefoot DPM   Gardiner Barefoot DPM

## 2016-08-25 DIAGNOSIS — H2512 Age-related nuclear cataract, left eye: Secondary | ICD-10-CM | POA: Diagnosis not present

## 2016-08-25 DIAGNOSIS — H401131 Primary open-angle glaucoma, bilateral, mild stage: Secondary | ICD-10-CM | POA: Diagnosis not present

## 2016-09-16 DIAGNOSIS — H2512 Age-related nuclear cataract, left eye: Secondary | ICD-10-CM | POA: Diagnosis not present

## 2016-09-16 DIAGNOSIS — Z7984 Long term (current) use of oral hypoglycemic drugs: Secondary | ICD-10-CM | POA: Diagnosis not present

## 2016-09-16 DIAGNOSIS — E1136 Type 2 diabetes mellitus with diabetic cataract: Secondary | ICD-10-CM | POA: Diagnosis not present

## 2016-09-16 DIAGNOSIS — H401121 Primary open-angle glaucoma, left eye, mild stage: Secondary | ICD-10-CM | POA: Diagnosis not present

## 2016-09-16 DIAGNOSIS — E119 Type 2 diabetes mellitus without complications: Secondary | ICD-10-CM | POA: Diagnosis not present

## 2016-09-16 DIAGNOSIS — Z882 Allergy status to sulfonamides status: Secondary | ICD-10-CM | POA: Diagnosis not present

## 2016-09-16 DIAGNOSIS — I10 Essential (primary) hypertension: Secondary | ICD-10-CM | POA: Diagnosis not present

## 2016-09-16 DIAGNOSIS — Z79899 Other long term (current) drug therapy: Secondary | ICD-10-CM | POA: Diagnosis not present

## 2016-09-16 DIAGNOSIS — K219 Gastro-esophageal reflux disease without esophagitis: Secondary | ICD-10-CM | POA: Diagnosis not present

## 2016-10-04 DIAGNOSIS — Z79899 Other long term (current) drug therapy: Secondary | ICD-10-CM | POA: Diagnosis not present

## 2016-10-04 DIAGNOSIS — I10 Essential (primary) hypertension: Secondary | ICD-10-CM | POA: Diagnosis not present

## 2016-10-04 DIAGNOSIS — E7800 Pure hypercholesterolemia, unspecified: Secondary | ICD-10-CM | POA: Diagnosis not present

## 2016-10-04 DIAGNOSIS — D72829 Elevated white blood cell count, unspecified: Secondary | ICD-10-CM | POA: Diagnosis not present

## 2016-10-04 DIAGNOSIS — R7303 Prediabetes: Secondary | ICD-10-CM | POA: Diagnosis not present

## 2016-10-07 DIAGNOSIS — I10 Essential (primary) hypertension: Secondary | ICD-10-CM | POA: Diagnosis not present

## 2016-10-07 DIAGNOSIS — E118 Type 2 diabetes mellitus with unspecified complications: Secondary | ICD-10-CM | POA: Diagnosis not present

## 2016-10-10 DIAGNOSIS — I10 Essential (primary) hypertension: Secondary | ICD-10-CM | POA: Diagnosis not present

## 2016-10-10 DIAGNOSIS — E118 Type 2 diabetes mellitus with unspecified complications: Secondary | ICD-10-CM | POA: Diagnosis not present

## 2016-10-12 DIAGNOSIS — R110 Nausea: Secondary | ICD-10-CM | POA: Diagnosis not present

## 2016-10-12 DIAGNOSIS — Z79899 Other long term (current) drug therapy: Secondary | ICD-10-CM | POA: Diagnosis not present

## 2016-10-12 DIAGNOSIS — H401121 Primary open-angle glaucoma, left eye, mild stage: Secondary | ICD-10-CM | POA: Diagnosis not present

## 2016-10-12 DIAGNOSIS — H538 Other visual disturbances: Secondary | ICD-10-CM | POA: Diagnosis not present

## 2016-10-12 DIAGNOSIS — H53149 Visual discomfort, unspecified: Secondary | ICD-10-CM | POA: Diagnosis not present

## 2016-10-12 DIAGNOSIS — E119 Type 2 diabetes mellitus without complications: Secondary | ICD-10-CM | POA: Diagnosis not present

## 2016-10-12 DIAGNOSIS — H579 Unspecified disorder of eye and adnexa: Secondary | ICD-10-CM | POA: Diagnosis not present

## 2016-10-12 DIAGNOSIS — R51 Headache: Secondary | ICD-10-CM | POA: Diagnosis not present

## 2016-10-12 DIAGNOSIS — Z7982 Long term (current) use of aspirin: Secondary | ICD-10-CM | POA: Diagnosis not present

## 2016-10-12 DIAGNOSIS — Z7984 Long term (current) use of oral hypoglycemic drugs: Secondary | ICD-10-CM | POA: Diagnosis not present

## 2016-10-12 DIAGNOSIS — Z961 Presence of intraocular lens: Secondary | ICD-10-CM | POA: Diagnosis not present

## 2016-10-12 DIAGNOSIS — H5712 Ocular pain, left eye: Secondary | ICD-10-CM | POA: Diagnosis not present

## 2016-10-12 DIAGNOSIS — Z791 Long term (current) use of non-steroidal anti-inflammatories (NSAID): Secondary | ICD-10-CM | POA: Diagnosis not present

## 2016-10-12 DIAGNOSIS — H401122 Primary open-angle glaucoma, left eye, moderate stage: Secondary | ICD-10-CM | POA: Diagnosis not present

## 2016-10-12 DIAGNOSIS — I10 Essential (primary) hypertension: Secondary | ICD-10-CM | POA: Diagnosis not present

## 2016-11-01 ENCOUNTER — Other Ambulatory Visit: Payer: Self-pay | Admitting: Internal Medicine

## 2016-11-01 DIAGNOSIS — Z1231 Encounter for screening mammogram for malignant neoplasm of breast: Secondary | ICD-10-CM

## 2016-11-08 DIAGNOSIS — E118 Type 2 diabetes mellitus with unspecified complications: Secondary | ICD-10-CM | POA: Diagnosis not present

## 2016-11-09 ENCOUNTER — Ambulatory Visit
Admission: RE | Admit: 2016-11-09 | Discharge: 2016-11-09 | Disposition: A | Discharge status: Home / Self Care | Payer: Medicare Other | Source: Ambulatory Visit | Attending: Internal Medicine | Admitting: Internal Medicine

## 2016-11-09 DIAGNOSIS — Z1231 Encounter for screening mammogram for malignant neoplasm of breast: Secondary | ICD-10-CM

## 2016-11-10 DIAGNOSIS — E785 Hyperlipidemia, unspecified: Secondary | ICD-10-CM | POA: Diagnosis not present

## 2016-11-10 DIAGNOSIS — E118 Type 2 diabetes mellitus with unspecified complications: Secondary | ICD-10-CM | POA: Diagnosis not present

## 2016-11-10 DIAGNOSIS — I10 Essential (primary) hypertension: Secondary | ICD-10-CM | POA: Diagnosis not present

## 2016-11-22 DIAGNOSIS — E118 Type 2 diabetes mellitus with unspecified complications: Secondary | ICD-10-CM | POA: Diagnosis not present

## 2016-11-30 DIAGNOSIS — E785 Hyperlipidemia, unspecified: Secondary | ICD-10-CM | POA: Diagnosis not present

## 2016-11-30 DIAGNOSIS — Z79899 Other long term (current) drug therapy: Secondary | ICD-10-CM | POA: Diagnosis not present

## 2016-11-30 DIAGNOSIS — Z8619 Personal history of other infectious and parasitic diseases: Secondary | ICD-10-CM | POA: Diagnosis not present

## 2016-11-30 DIAGNOSIS — Z23 Encounter for immunization: Secondary | ICD-10-CM | POA: Diagnosis not present

## 2016-11-30 DIAGNOSIS — E1121 Type 2 diabetes mellitus with diabetic nephropathy: Secondary | ICD-10-CM | POA: Diagnosis not present

## 2017-01-05 DIAGNOSIS — Z961 Presence of intraocular lens: Secondary | ICD-10-CM | POA: Diagnosis not present

## 2017-01-05 DIAGNOSIS — H401131 Primary open-angle glaucoma, bilateral, mild stage: Secondary | ICD-10-CM | POA: Diagnosis not present

## 2017-01-06 DIAGNOSIS — E118 Type 2 diabetes mellitus with unspecified complications: Secondary | ICD-10-CM | POA: Diagnosis not present

## 2017-01-06 DIAGNOSIS — E785 Hyperlipidemia, unspecified: Secondary | ICD-10-CM | POA: Diagnosis not present

## 2017-01-16 DIAGNOSIS — I10 Essential (primary) hypertension: Secondary | ICD-10-CM | POA: Diagnosis not present

## 2017-01-16 DIAGNOSIS — E1121 Type 2 diabetes mellitus with diabetic nephropathy: Secondary | ICD-10-CM | POA: Diagnosis not present

## 2017-01-16 DIAGNOSIS — E782 Mixed hyperlipidemia: Secondary | ICD-10-CM | POA: Diagnosis not present

## 2017-01-18 DIAGNOSIS — I10 Essential (primary) hypertension: Secondary | ICD-10-CM | POA: Diagnosis not present

## 2017-01-18 DIAGNOSIS — E1121 Type 2 diabetes mellitus with diabetic nephropathy: Secondary | ICD-10-CM | POA: Diagnosis not present

## 2017-01-18 DIAGNOSIS — Z23 Encounter for immunization: Secondary | ICD-10-CM | POA: Diagnosis not present

## 2017-02-07 DIAGNOSIS — L602 Onychogryphosis: Secondary | ICD-10-CM | POA: Diagnosis not present

## 2017-02-07 DIAGNOSIS — L84 Corns and callosities: Secondary | ICD-10-CM | POA: Diagnosis not present

## 2017-02-07 DIAGNOSIS — E1151 Type 2 diabetes mellitus with diabetic peripheral angiopathy without gangrene: Secondary | ICD-10-CM | POA: Diagnosis not present

## 2017-02-07 DIAGNOSIS — M21962 Unspecified acquired deformity of left lower leg: Secondary | ICD-10-CM | POA: Diagnosis not present

## 2017-02-07 DIAGNOSIS — M79674 Pain in right toe(s): Secondary | ICD-10-CM | POA: Diagnosis not present

## 2017-02-07 DIAGNOSIS — M21961 Unspecified acquired deformity of right lower leg: Secondary | ICD-10-CM | POA: Diagnosis not present

## 2017-02-16 DIAGNOSIS — H401132 Primary open-angle glaucoma, bilateral, moderate stage: Secondary | ICD-10-CM | POA: Diagnosis not present

## 2017-02-22 DIAGNOSIS — L03031 Cellulitis of right toe: Secondary | ICD-10-CM | POA: Diagnosis not present

## 2017-03-09 DIAGNOSIS — L03031 Cellulitis of right toe: Secondary | ICD-10-CM | POA: Diagnosis not present

## 2017-03-13 DIAGNOSIS — E782 Mixed hyperlipidemia: Secondary | ICD-10-CM | POA: Diagnosis not present

## 2017-03-21 DIAGNOSIS — I10 Essential (primary) hypertension: Secondary | ICD-10-CM | POA: Diagnosis not present

## 2017-04-13 DIAGNOSIS — E1121 Type 2 diabetes mellitus with diabetic nephropathy: Secondary | ICD-10-CM | POA: Diagnosis not present

## 2017-04-13 DIAGNOSIS — E782 Mixed hyperlipidemia: Secondary | ICD-10-CM | POA: Diagnosis not present

## 2017-04-17 DIAGNOSIS — E1121 Type 2 diabetes mellitus with diabetic nephropathy: Secondary | ICD-10-CM | POA: Diagnosis not present

## 2017-04-17 DIAGNOSIS — I10 Essential (primary) hypertension: Secondary | ICD-10-CM | POA: Diagnosis not present

## 2017-04-17 DIAGNOSIS — K219 Gastro-esophageal reflux disease without esophagitis: Secondary | ICD-10-CM | POA: Diagnosis not present

## 2017-05-04 DIAGNOSIS — I10 Essential (primary) hypertension: Secondary | ICD-10-CM | POA: Diagnosis not present

## 2017-05-04 DIAGNOSIS — E559 Vitamin D deficiency, unspecified: Secondary | ICD-10-CM | POA: Diagnosis not present

## 2017-05-04 DIAGNOSIS — E118 Type 2 diabetes mellitus with unspecified complications: Secondary | ICD-10-CM | POA: Diagnosis not present

## 2017-05-10 DIAGNOSIS — Z0001 Encounter for general adult medical examination with abnormal findings: Secondary | ICD-10-CM | POA: Diagnosis not present

## 2017-05-10 DIAGNOSIS — E559 Vitamin D deficiency, unspecified: Secondary | ICD-10-CM | POA: Diagnosis not present

## 2017-05-10 DIAGNOSIS — E1121 Type 2 diabetes mellitus with diabetic nephropathy: Secondary | ICD-10-CM | POA: Diagnosis not present

## 2017-05-10 DIAGNOSIS — H9319 Tinnitus, unspecified ear: Secondary | ICD-10-CM | POA: Diagnosis not present

## 2017-05-10 DIAGNOSIS — I10 Essential (primary) hypertension: Secondary | ICD-10-CM | POA: Diagnosis not present

## 2017-05-16 DIAGNOSIS — L84 Corns and callosities: Secondary | ICD-10-CM | POA: Diagnosis not present

## 2017-05-16 DIAGNOSIS — E1151 Type 2 diabetes mellitus with diabetic peripheral angiopathy without gangrene: Secondary | ICD-10-CM | POA: Diagnosis not present

## 2017-05-16 DIAGNOSIS — L602 Onychogryphosis: Secondary | ICD-10-CM | POA: Diagnosis not present

## 2017-06-12 DIAGNOSIS — E7800 Pure hypercholesterolemia, unspecified: Secondary | ICD-10-CM | POA: Diagnosis not present

## 2017-06-12 DIAGNOSIS — Z79899 Other long term (current) drug therapy: Secondary | ICD-10-CM | POA: Diagnosis not present

## 2017-06-12 DIAGNOSIS — I429 Cardiomyopathy, unspecified: Secondary | ICD-10-CM | POA: Diagnosis not present

## 2017-06-12 DIAGNOSIS — Z1159 Encounter for screening for other viral diseases: Secondary | ICD-10-CM | POA: Diagnosis not present

## 2017-06-12 DIAGNOSIS — R7303 Prediabetes: Secondary | ICD-10-CM | POA: Diagnosis not present

## 2017-06-22 DIAGNOSIS — H401132 Primary open-angle glaucoma, bilateral, moderate stage: Secondary | ICD-10-CM | POA: Diagnosis not present

## 2017-06-22 DIAGNOSIS — H2512 Age-related nuclear cataract, left eye: Secondary | ICD-10-CM | POA: Diagnosis not present

## 2017-08-02 DIAGNOSIS — I10 Essential (primary) hypertension: Secondary | ICD-10-CM | POA: Diagnosis not present

## 2017-08-02 DIAGNOSIS — E1121 Type 2 diabetes mellitus with diabetic nephropathy: Secondary | ICD-10-CM | POA: Diagnosis not present

## 2017-08-02 DIAGNOSIS — E782 Mixed hyperlipidemia: Secondary | ICD-10-CM | POA: Diagnosis not present

## 2017-08-10 DIAGNOSIS — E1121 Type 2 diabetes mellitus with diabetic nephropathy: Secondary | ICD-10-CM | POA: Diagnosis not present

## 2017-08-11 ENCOUNTER — Other Ambulatory Visit: Payer: Self-pay

## 2017-08-11 NOTE — Patient Outreach (Signed)
[place] [facility]) Care Management  08/11/2017  [name] 1945-05-03 [id number]   Medication Adherence call to [name] left a message for patient to call back patient is due on Rosuvastatin 5 mg.[name] is showing past due under [place].   [place] Management Direct Dial [phone]  Fax [phone] [email or web address]

## 2017-08-15 DIAGNOSIS — M21962 Unspecified acquired deformity of left lower leg: Secondary | ICD-10-CM | POA: Diagnosis not present

## 2017-08-15 DIAGNOSIS — L84 Corns and callosities: Secondary | ICD-10-CM | POA: Diagnosis not present

## 2017-08-15 DIAGNOSIS — E1151 Type 2 diabetes mellitus with diabetic peripheral angiopathy without gangrene: Secondary | ICD-10-CM | POA: Diagnosis not present

## 2017-08-15 DIAGNOSIS — M21961 Unspecified acquired deformity of right lower leg: Secondary | ICD-10-CM | POA: Diagnosis not present

## 2017-08-15 DIAGNOSIS — M79674 Pain in right toe(s): Secondary | ICD-10-CM | POA: Diagnosis not present

## 2017-08-15 DIAGNOSIS — L602 Onychogryphosis: Secondary | ICD-10-CM | POA: Diagnosis not present

## 2017-08-17 DIAGNOSIS — E1121 Type 2 diabetes mellitus with diabetic nephropathy: Secondary | ICD-10-CM | POA: Diagnosis not present

## 2017-08-17 DIAGNOSIS — I10 Essential (primary) hypertension: Secondary | ICD-10-CM | POA: Diagnosis not present

## 2017-09-28 DIAGNOSIS — H401132 Primary open-angle glaucoma, bilateral, moderate stage: Secondary | ICD-10-CM | POA: Diagnosis not present

## 2017-10-27 DIAGNOSIS — Z23 Encounter for immunization: Secondary | ICD-10-CM | POA: Diagnosis not present

## 2017-11-07 DIAGNOSIS — M25562 Pain in left knee: Secondary | ICD-10-CM | POA: Diagnosis not present

## 2017-11-07 DIAGNOSIS — I10 Essential (primary) hypertension: Secondary | ICD-10-CM | POA: Diagnosis not present

## 2017-11-07 DIAGNOSIS — E1121 Type 2 diabetes mellitus with diabetic nephropathy: Secondary | ICD-10-CM | POA: Diagnosis not present

## 2017-11-07 DIAGNOSIS — E559 Vitamin D deficiency, unspecified: Secondary | ICD-10-CM | POA: Diagnosis not present

## 2017-11-14 DIAGNOSIS — L602 Onychogryphosis: Secondary | ICD-10-CM | POA: Diagnosis not present

## 2017-11-14 DIAGNOSIS — L84 Corns and callosities: Secondary | ICD-10-CM | POA: Diagnosis not present

## 2017-11-14 DIAGNOSIS — E1151 Type 2 diabetes mellitus with diabetic peripheral angiopathy without gangrene: Secondary | ICD-10-CM | POA: Diagnosis not present

## 2017-11-16 DIAGNOSIS — E559 Vitamin D deficiency, unspecified: Secondary | ICD-10-CM | POA: Diagnosis not present

## 2017-11-16 DIAGNOSIS — E782 Mixed hyperlipidemia: Secondary | ICD-10-CM | POA: Diagnosis not present

## 2017-11-16 DIAGNOSIS — E1121 Type 2 diabetes mellitus with diabetic nephropathy: Secondary | ICD-10-CM | POA: Diagnosis not present

## 2017-11-23 DIAGNOSIS — E559 Vitamin D deficiency, unspecified: Secondary | ICD-10-CM | POA: Diagnosis not present

## 2017-11-23 DIAGNOSIS — I10 Essential (primary) hypertension: Secondary | ICD-10-CM | POA: Diagnosis not present

## 2017-11-23 DIAGNOSIS — E1121 Type 2 diabetes mellitus with diabetic nephropathy: Secondary | ICD-10-CM | POA: Diagnosis not present

## 2017-11-23 DIAGNOSIS — E782 Mixed hyperlipidemia: Secondary | ICD-10-CM | POA: Diagnosis not present

## 2017-11-28 ENCOUNTER — Other Ambulatory Visit: Payer: Self-pay | Admitting: Internal Medicine

## 2017-11-28 DIAGNOSIS — Z1231 Encounter for screening mammogram for malignant neoplasm of breast: Secondary | ICD-10-CM

## 2017-11-30 ENCOUNTER — Other Ambulatory Visit: Payer: Self-pay

## 2017-11-30 NOTE — Patient Outreach (Signed)
[place] [facility]) Care Management  11/30/2017  LAYLA KESLING 11-22-1945 [id number]   Medication Adherence call to Mrs. Sherise Geerdes left a message with her husband to call back patient is due on Rosuvastatin 5 mg under [place].   [place] Management Direct Dial [phone]  Fax [phone] [email or web address]

## 2018-01-11 ENCOUNTER — Ambulatory Visit
Admission: RE | Admit: 2018-01-11 | Discharge: 2018-01-11 | Disposition: A | Discharge status: Home / Self Care | Payer: Medicare Other | Source: Ambulatory Visit | Attending: Internal Medicine | Admitting: Internal Medicine

## 2018-01-11 DIAGNOSIS — Z1231 Encounter for screening mammogram for malignant neoplasm of breast: Secondary | ICD-10-CM

## 2018-02-08 DIAGNOSIS — H401132 Primary open-angle glaucoma, bilateral, moderate stage: Secondary | ICD-10-CM | POA: Diagnosis not present

## 2018-02-15 DIAGNOSIS — E1121 Type 2 diabetes mellitus with diabetic nephropathy: Secondary | ICD-10-CM | POA: Diagnosis not present

## 2018-02-15 DIAGNOSIS — E559 Vitamin D deficiency, unspecified: Secondary | ICD-10-CM | POA: Diagnosis not present

## 2018-02-22 DIAGNOSIS — G8929 Other chronic pain: Secondary | ICD-10-CM | POA: Diagnosis not present

## 2018-02-22 DIAGNOSIS — E1121 Type 2 diabetes mellitus with diabetic nephropathy: Secondary | ICD-10-CM | POA: Diagnosis not present

## 2018-02-22 DIAGNOSIS — E782 Mixed hyperlipidemia: Secondary | ICD-10-CM | POA: Diagnosis not present

## 2018-02-22 DIAGNOSIS — I10 Essential (primary) hypertension: Secondary | ICD-10-CM | POA: Diagnosis not present

## 2018-02-22 DIAGNOSIS — Z23 Encounter for immunization: Secondary | ICD-10-CM | POA: Diagnosis not present

## 2018-03-08 DIAGNOSIS — L602 Onychogryphosis: Secondary | ICD-10-CM | POA: Diagnosis not present

## 2018-03-08 DIAGNOSIS — L84 Corns and callosities: Secondary | ICD-10-CM | POA: Diagnosis not present

## 2018-03-08 DIAGNOSIS — E1151 Type 2 diabetes mellitus with diabetic peripheral angiopathy without gangrene: Secondary | ICD-10-CM | POA: Diagnosis not present

## 2018-03-15 DIAGNOSIS — I10 Essential (primary) hypertension: Secondary | ICD-10-CM | POA: Diagnosis not present

## 2018-03-15 DIAGNOSIS — E1121 Type 2 diabetes mellitus with diabetic nephropathy: Secondary | ICD-10-CM | POA: Diagnosis not present

## 2018-03-15 DIAGNOSIS — E782 Mixed hyperlipidemia: Secondary | ICD-10-CM | POA: Diagnosis not present

## 2018-04-04 DIAGNOSIS — K625 Hemorrhage of anus and rectum: Secondary | ICD-10-CM | POA: Diagnosis not present

## 2018-04-04 DIAGNOSIS — K5730 Diverticulosis of large intestine without perforation or abscess without bleeding: Secondary | ICD-10-CM | POA: Diagnosis not present

## 2018-04-04 DIAGNOSIS — Z86010 Personal history of colonic polyps: Secondary | ICD-10-CM | POA: Diagnosis not present

## 2018-05-10 DIAGNOSIS — I10 Essential (primary) hypertension: Secondary | ICD-10-CM | POA: Diagnosis not present

## 2018-05-10 DIAGNOSIS — E1121 Type 2 diabetes mellitus with diabetic nephropathy: Secondary | ICD-10-CM | POA: Diagnosis not present

## 2018-05-15 DIAGNOSIS — E1121 Type 2 diabetes mellitus with diabetic nephropathy: Secondary | ICD-10-CM | POA: Diagnosis not present

## 2018-05-15 DIAGNOSIS — K219 Gastro-esophageal reflux disease without esophagitis: Secondary | ICD-10-CM | POA: Diagnosis not present

## 2018-05-15 DIAGNOSIS — Z0000 Encounter for general adult medical examination without abnormal findings: Secondary | ICD-10-CM | POA: Diagnosis not present

## 2018-05-15 DIAGNOSIS — I10 Essential (primary) hypertension: Secondary | ICD-10-CM | POA: Diagnosis not present

## 2018-05-15 DIAGNOSIS — E782 Mixed hyperlipidemia: Secondary | ICD-10-CM | POA: Diagnosis not present

## 2018-05-15 DIAGNOSIS — E1142 Type 2 diabetes mellitus with diabetic polyneuropathy: Secondary | ICD-10-CM | POA: Diagnosis not present

## 2018-07-12 DIAGNOSIS — L84 Corns and callosities: Secondary | ICD-10-CM | POA: Diagnosis not present

## 2018-07-12 DIAGNOSIS — L602 Onychogryphosis: Secondary | ICD-10-CM | POA: Diagnosis not present

## 2018-07-12 DIAGNOSIS — E1151 Type 2 diabetes mellitus with diabetic peripheral angiopathy without gangrene: Secondary | ICD-10-CM | POA: Diagnosis not present

## 2018-07-26 DIAGNOSIS — H401132 Primary open-angle glaucoma, bilateral, moderate stage: Secondary | ICD-10-CM | POA: Diagnosis not present

## 2018-07-26 DIAGNOSIS — H2512 Age-related nuclear cataract, left eye: Secondary | ICD-10-CM | POA: Diagnosis not present

## 2018-08-16 DIAGNOSIS — I10 Essential (primary) hypertension: Secondary | ICD-10-CM | POA: Diagnosis not present

## 2018-08-16 DIAGNOSIS — Z780 Asymptomatic menopausal state: Secondary | ICD-10-CM | POA: Diagnosis not present

## 2018-08-16 DIAGNOSIS — E1121 Type 2 diabetes mellitus with diabetic nephropathy: Secondary | ICD-10-CM | POA: Diagnosis not present

## 2018-08-23 DIAGNOSIS — G8929 Other chronic pain: Secondary | ICD-10-CM | POA: Diagnosis not present

## 2018-08-23 DIAGNOSIS — E782 Mixed hyperlipidemia: Secondary | ICD-10-CM | POA: Diagnosis not present

## 2018-08-23 DIAGNOSIS — E1121 Type 2 diabetes mellitus with diabetic nephropathy: Secondary | ICD-10-CM | POA: Diagnosis not present

## 2018-08-23 DIAGNOSIS — I10 Essential (primary) hypertension: Secondary | ICD-10-CM | POA: Diagnosis not present

## 2018-09-05 ENCOUNTER — Other Ambulatory Visit: Payer: Self-pay

## 2018-09-05 NOTE — Patient Outreach (Signed)
[place] [facility]) Care Management  09/05/2018  [name] 1945/09/29 [id number]   Medication Adherence call to [name] HIPPA Compliant Voice message left with a call back number. [name] is showing past due on Rosuvastatin 5 mg under [place].   [place] Management Direct Dial [phone]  Fax [phone] [email or web address]

## 2018-09-11 DIAGNOSIS — R609 Edema, unspecified: Secondary | ICD-10-CM | POA: Diagnosis not present

## 2018-09-11 DIAGNOSIS — R51 Headache: Secondary | ICD-10-CM | POA: Diagnosis not present

## 2018-09-11 DIAGNOSIS — I10 Essential (primary) hypertension: Secondary | ICD-10-CM | POA: Diagnosis not present

## 2018-10-11 DIAGNOSIS — R6889 Other general symptoms and signs: Secondary | ICD-10-CM | POA: Diagnosis not present

## 2018-10-12 DIAGNOSIS — Z23 Encounter for immunization: Secondary | ICD-10-CM | POA: Diagnosis not present

## 2018-10-12 DIAGNOSIS — R51 Headache: Secondary | ICD-10-CM | POA: Diagnosis not present

## 2018-10-12 DIAGNOSIS — R609 Edema, unspecified: Secondary | ICD-10-CM | POA: Diagnosis not present

## 2018-10-12 DIAGNOSIS — I10 Essential (primary) hypertension: Secondary | ICD-10-CM | POA: Diagnosis not present

## 2018-10-18 DIAGNOSIS — E1151 Type 2 diabetes mellitus with diabetic peripheral angiopathy without gangrene: Secondary | ICD-10-CM | POA: Diagnosis not present

## 2018-10-18 DIAGNOSIS — L84 Corns and callosities: Secondary | ICD-10-CM | POA: Diagnosis not present

## 2018-10-18 DIAGNOSIS — L602 Onychogryphosis: Secondary | ICD-10-CM | POA: Diagnosis not present

## 2018-11-08 DIAGNOSIS — D123 Benign neoplasm of transverse colon: Secondary | ICD-10-CM | POA: Diagnosis not present

## 2018-11-08 DIAGNOSIS — K5730 Diverticulosis of large intestine without perforation or abscess without bleeding: Secondary | ICD-10-CM | POA: Diagnosis not present

## 2018-11-08 DIAGNOSIS — K635 Polyp of colon: Secondary | ICD-10-CM | POA: Diagnosis not present

## 2018-11-08 DIAGNOSIS — Z86010 Personal history of colonic polyps: Secondary | ICD-10-CM | POA: Diagnosis not present

## 2018-11-16 DIAGNOSIS — E118 Type 2 diabetes mellitus with unspecified complications: Secondary | ICD-10-CM | POA: Diagnosis not present

## 2018-11-26 DIAGNOSIS — E118 Type 2 diabetes mellitus with unspecified complications: Secondary | ICD-10-CM | POA: Diagnosis not present

## 2018-11-26 DIAGNOSIS — E1142 Type 2 diabetes mellitus with diabetic polyneuropathy: Secondary | ICD-10-CM | POA: Diagnosis not present

## 2018-11-26 DIAGNOSIS — I10 Essential (primary) hypertension: Secondary | ICD-10-CM | POA: Diagnosis not present

## 2018-11-26 DIAGNOSIS — K219 Gastro-esophageal reflux disease without esophagitis: Secondary | ICD-10-CM | POA: Diagnosis not present

## 2018-11-29 DIAGNOSIS — H401132 Primary open-angle glaucoma, bilateral, moderate stage: Secondary | ICD-10-CM | POA: Diagnosis not present

## 2018-11-29 DIAGNOSIS — H2512 Age-related nuclear cataract, left eye: Secondary | ICD-10-CM | POA: Diagnosis not present

## 2018-12-11 ENCOUNTER — Other Ambulatory Visit: Payer: Self-pay

## 2018-12-11 NOTE — Patient Outreach (Signed)
[place] [facility]) Care Management  12/11/2018  NOVEL SCHUCHMANN Jun 16, 1945 [id number]   Medication Adherence call to Mrs. [name] Hippa Identifiers Verify spoke with patient she is past due on Metformin Er 500 mg patient explain she take 1 tablet three times daily,patient explain she has a few but ask if we can call [facility] to order it,pharmacy will have it ready for patient to pick up.Mrs. Zaragoza is showing past due under [place].   [place] Management Direct Dial [phone]  Fax [phone] [email or web address]

## 2019-01-17 DIAGNOSIS — L602 Onychogryphosis: Secondary | ICD-10-CM | POA: Diagnosis not present

## 2019-01-17 DIAGNOSIS — E1151 Type 2 diabetes mellitus with diabetic peripheral angiopathy without gangrene: Secondary | ICD-10-CM | POA: Diagnosis not present

## 2019-01-17 DIAGNOSIS — L84 Corns and callosities: Secondary | ICD-10-CM | POA: Diagnosis not present

## 2019-02-12 ENCOUNTER — Other Ambulatory Visit: Payer: Self-pay | Admitting: Internal Medicine

## 2019-02-12 DIAGNOSIS — Z1231 Encounter for screening mammogram for malignant neoplasm of breast: Secondary | ICD-10-CM

## 2019-02-21 DIAGNOSIS — E782 Mixed hyperlipidemia: Secondary | ICD-10-CM | POA: Diagnosis not present

## 2019-02-21 DIAGNOSIS — I10 Essential (primary) hypertension: Secondary | ICD-10-CM | POA: Diagnosis not present

## 2019-02-21 DIAGNOSIS — E559 Vitamin D deficiency, unspecified: Secondary | ICD-10-CM | POA: Diagnosis not present

## 2019-02-21 DIAGNOSIS — E1121 Type 2 diabetes mellitus with diabetic nephropathy: Secondary | ICD-10-CM | POA: Diagnosis not present

## 2019-02-26 ENCOUNTER — Ambulatory Visit: Payer: Medicare Other

## 2019-03-07 ENCOUNTER — Ambulatory Visit: Payer: Medicare Other

## 2019-03-07 DIAGNOSIS — J00 Acute nasopharyngitis [common cold]: Secondary | ICD-10-CM | POA: Diagnosis not present

## 2019-03-07 DIAGNOSIS — H938X3 Other specified disorders of ear, bilateral: Secondary | ICD-10-CM | POA: Diagnosis not present

## 2019-03-12 DIAGNOSIS — M150 Primary generalized (osteo)arthritis: Secondary | ICD-10-CM | POA: Diagnosis not present

## 2019-03-12 DIAGNOSIS — M7061 Trochanteric bursitis, right hip: Secondary | ICD-10-CM | POA: Diagnosis not present

## 2019-03-12 DIAGNOSIS — M25551 Pain in right hip: Secondary | ICD-10-CM | POA: Diagnosis not present

## 2019-03-20 ENCOUNTER — Other Ambulatory Visit: Payer: Self-pay

## 2019-03-20 ENCOUNTER — Ambulatory Visit
Admission: RE | Admit: 2019-03-20 | Discharge: 2019-03-20 | Disposition: A | Discharge status: Home / Self Care | Payer: Medicare Other | Source: Ambulatory Visit | Attending: Internal Medicine | Admitting: Internal Medicine

## 2019-03-20 DIAGNOSIS — Z1231 Encounter for screening mammogram for malignant neoplasm of breast: Secondary | ICD-10-CM | POA: Diagnosis not present

## 2019-03-22 ENCOUNTER — Other Ambulatory Visit: Payer: Self-pay

## 2019-03-22 NOTE — Patient Outreach (Signed)
[place] [facility]) Care Management  03/22/2019  [name] 05-01-1945 [id number]   Medication Adherence call to Mrs. [name] Hippa Identifiers Verify spoke with patient she is past due on Metformin Er 500 mg,patient explain she is now only taking 3 tablets daily instead of 4 tablets daily reason why she has medication at this time. [name].   [place] Management Direct Dial [phone]  Fax [phone] [email or web address]

## 2019-03-28 DIAGNOSIS — E119 Type 2 diabetes mellitus without complications: Secondary | ICD-10-CM | POA: Diagnosis not present

## 2019-03-28 DIAGNOSIS — H401132 Primary open-angle glaucoma, bilateral, moderate stage: Secondary | ICD-10-CM | POA: Diagnosis not present

## 2019-04-01 DIAGNOSIS — R2689 Other abnormalities of gait and mobility: Secondary | ICD-10-CM | POA: Diagnosis not present

## 2019-04-01 DIAGNOSIS — L602 Onychogryphosis: Secondary | ICD-10-CM | POA: Diagnosis not present

## 2019-04-01 DIAGNOSIS — E1151 Type 2 diabetes mellitus with diabetic peripheral angiopathy without gangrene: Secondary | ICD-10-CM | POA: Diagnosis not present

## 2019-04-01 DIAGNOSIS — M21961 Unspecified acquired deformity of right lower leg: Secondary | ICD-10-CM | POA: Diagnosis not present

## 2019-04-01 DIAGNOSIS — L03031 Cellulitis of right toe: Secondary | ICD-10-CM | POA: Diagnosis not present

## 2019-04-01 DIAGNOSIS — M21962 Unspecified acquired deformity of left lower leg: Secondary | ICD-10-CM | POA: Diagnosis not present

## 2019-04-01 DIAGNOSIS — L84 Corns and callosities: Secondary | ICD-10-CM | POA: Diagnosis not present

## 2019-05-08 DIAGNOSIS — E1151 Type 2 diabetes mellitus with diabetic peripheral angiopathy without gangrene: Secondary | ICD-10-CM | POA: Diagnosis not present

## 2019-05-08 DIAGNOSIS — L03031 Cellulitis of right toe: Secondary | ICD-10-CM | POA: Diagnosis not present

## 2019-05-08 DIAGNOSIS — I739 Peripheral vascular disease, unspecified: Secondary | ICD-10-CM | POA: Diagnosis not present

## 2019-05-16 DIAGNOSIS — E118 Type 2 diabetes mellitus with unspecified complications: Secondary | ICD-10-CM | POA: Diagnosis not present

## 2019-05-16 DIAGNOSIS — I10 Essential (primary) hypertension: Secondary | ICD-10-CM | POA: Diagnosis not present

## 2019-05-20 DIAGNOSIS — H401132 Primary open-angle glaucoma, bilateral, moderate stage: Secondary | ICD-10-CM | POA: Diagnosis not present

## 2019-05-20 DIAGNOSIS — E119 Type 2 diabetes mellitus without complications: Secondary | ICD-10-CM | POA: Diagnosis not present

## 2019-05-21 DIAGNOSIS — I10 Essential (primary) hypertension: Secondary | ICD-10-CM | POA: Diagnosis not present

## 2019-05-21 DIAGNOSIS — E1121 Type 2 diabetes mellitus with diabetic nephropathy: Secondary | ICD-10-CM | POA: Diagnosis not present

## 2019-05-21 DIAGNOSIS — M79672 Pain in left foot: Secondary | ICD-10-CM | POA: Diagnosis not present

## 2019-05-21 DIAGNOSIS — E118 Type 2 diabetes mellitus with unspecified complications: Secondary | ICD-10-CM | POA: Diagnosis not present

## 2019-05-21 DIAGNOSIS — Z0000 Encounter for general adult medical examination without abnormal findings: Secondary | ICD-10-CM | POA: Diagnosis not present

## 2019-05-22 DIAGNOSIS — I70223 Atherosclerosis of native arteries of extremities with rest pain, bilateral legs: Secondary | ICD-10-CM | POA: Diagnosis not present

## 2019-05-23 DIAGNOSIS — I70202 Unspecified atherosclerosis of native arteries of extremities, left leg: Secondary | ICD-10-CM | POA: Diagnosis not present

## 2019-05-23 DIAGNOSIS — I739 Peripheral vascular disease, unspecified: Secondary | ICD-10-CM | POA: Diagnosis not present

## 2019-06-04 DIAGNOSIS — E782 Mixed hyperlipidemia: Secondary | ICD-10-CM | POA: Diagnosis not present

## 2019-06-04 DIAGNOSIS — E1121 Type 2 diabetes mellitus with diabetic nephropathy: Secondary | ICD-10-CM | POA: Diagnosis not present

## 2019-06-04 DIAGNOSIS — I10 Essential (primary) hypertension: Secondary | ICD-10-CM | POA: Diagnosis not present

## 2019-07-08 DIAGNOSIS — L602 Onychogryphosis: Secondary | ICD-10-CM | POA: Diagnosis not present

## 2019-07-08 DIAGNOSIS — E1151 Type 2 diabetes mellitus with diabetic peripheral angiopathy without gangrene: Secondary | ICD-10-CM | POA: Diagnosis not present

## 2019-07-08 DIAGNOSIS — L03031 Cellulitis of right toe: Secondary | ICD-10-CM | POA: Diagnosis not present

## 2019-07-08 DIAGNOSIS — M21962 Unspecified acquired deformity of left lower leg: Secondary | ICD-10-CM | POA: Diagnosis not present

## 2019-07-08 DIAGNOSIS — M21961 Unspecified acquired deformity of right lower leg: Secondary | ICD-10-CM | POA: Diagnosis not present

## 2019-07-08 DIAGNOSIS — L84 Corns and callosities: Secondary | ICD-10-CM | POA: Diagnosis not present

## 2019-07-15 DIAGNOSIS — M79674 Pain in right toe(s): Secondary | ICD-10-CM | POA: Diagnosis not present

## 2019-07-15 DIAGNOSIS — L03031 Cellulitis of right toe: Secondary | ICD-10-CM | POA: Diagnosis not present

## 2019-07-15 DIAGNOSIS — E1151 Type 2 diabetes mellitus with diabetic peripheral angiopathy without gangrene: Secondary | ICD-10-CM | POA: Diagnosis not present

## 2019-07-15 DIAGNOSIS — I739 Peripheral vascular disease, unspecified: Secondary | ICD-10-CM | POA: Diagnosis not present

## 2019-07-16 DIAGNOSIS — M2550 Pain in unspecified joint: Secondary | ICD-10-CM | POA: Diagnosis not present

## 2019-07-16 DIAGNOSIS — E1121 Type 2 diabetes mellitus with diabetic nephropathy: Secondary | ICD-10-CM | POA: Diagnosis not present

## 2019-07-16 DIAGNOSIS — I10 Essential (primary) hypertension: Secondary | ICD-10-CM | POA: Diagnosis not present

## 2019-07-16 DIAGNOSIS — R600 Localized edema: Secondary | ICD-10-CM | POA: Diagnosis not present

## 2019-07-22 DIAGNOSIS — E118 Type 2 diabetes mellitus with unspecified complications: Secondary | ICD-10-CM | POA: Diagnosis not present

## 2019-07-22 DIAGNOSIS — I10 Essential (primary) hypertension: Secondary | ICD-10-CM | POA: Diagnosis not present

## 2019-08-14 DIAGNOSIS — E1121 Type 2 diabetes mellitus with diabetic nephropathy: Secondary | ICD-10-CM | POA: Diagnosis not present

## 2019-08-14 DIAGNOSIS — I10 Essential (primary) hypertension: Secondary | ICD-10-CM | POA: Diagnosis not present

## 2019-09-05 DIAGNOSIS — R062 Wheezing: Secondary | ICD-10-CM | POA: Diagnosis not present

## 2019-09-05 DIAGNOSIS — Z20822 Contact with and (suspected) exposure to covid-19: Secondary | ICD-10-CM | POA: Diagnosis not present

## 2019-09-05 DIAGNOSIS — R05 Cough: Secondary | ICD-10-CM | POA: Diagnosis not present

## 2019-09-09 DIAGNOSIS — L03031 Cellulitis of right toe: Secondary | ICD-10-CM | POA: Diagnosis not present

## 2019-09-23 DIAGNOSIS — I739 Peripheral vascular disease, unspecified: Secondary | ICD-10-CM | POA: Diagnosis not present

## 2019-09-23 DIAGNOSIS — L03031 Cellulitis of right toe: Secondary | ICD-10-CM | POA: Diagnosis not present

## 2019-09-23 DIAGNOSIS — R2689 Other abnormalities of gait and mobility: Secondary | ICD-10-CM | POA: Diagnosis not present

## 2019-09-23 DIAGNOSIS — E1151 Type 2 diabetes mellitus with diabetic peripheral angiopathy without gangrene: Secondary | ICD-10-CM | POA: Diagnosis not present

## 2019-09-25 DIAGNOSIS — E1121 Type 2 diabetes mellitus with diabetic nephropathy: Secondary | ICD-10-CM | POA: Diagnosis not present

## 2019-09-25 DIAGNOSIS — I10 Essential (primary) hypertension: Secondary | ICD-10-CM | POA: Diagnosis not present

## 2019-09-25 DIAGNOSIS — E118 Type 2 diabetes mellitus with unspecified complications: Secondary | ICD-10-CM | POA: Diagnosis not present

## 2019-10-10 DIAGNOSIS — I739 Peripheral vascular disease, unspecified: Secondary | ICD-10-CM | POA: Diagnosis not present

## 2019-10-14 DIAGNOSIS — L84 Corns and callosities: Secondary | ICD-10-CM | POA: Diagnosis not present

## 2019-10-14 DIAGNOSIS — L602 Onychogryphosis: Secondary | ICD-10-CM | POA: Diagnosis not present

## 2019-10-14 DIAGNOSIS — I739 Peripheral vascular disease, unspecified: Secondary | ICD-10-CM | POA: Diagnosis not present

## 2019-10-15 DIAGNOSIS — G4700 Insomnia, unspecified: Secondary | ICD-10-CM | POA: Diagnosis not present

## 2019-10-15 DIAGNOSIS — I10 Essential (primary) hypertension: Secondary | ICD-10-CM | POA: Diagnosis not present

## 2019-10-15 DIAGNOSIS — E782 Mixed hyperlipidemia: Secondary | ICD-10-CM | POA: Diagnosis not present

## 2019-10-15 DIAGNOSIS — E118 Type 2 diabetes mellitus with unspecified complications: Secondary | ICD-10-CM | POA: Diagnosis not present

## 2019-11-04 DIAGNOSIS — E119 Type 2 diabetes mellitus without complications: Secondary | ICD-10-CM | POA: Diagnosis not present

## 2019-11-04 DIAGNOSIS — H2512 Age-related nuclear cataract, left eye: Secondary | ICD-10-CM | POA: Diagnosis not present

## 2019-11-04 DIAGNOSIS — H401132 Primary open-angle glaucoma, bilateral, moderate stage: Secondary | ICD-10-CM | POA: Diagnosis not present

## 2019-11-08 DIAGNOSIS — Z23 Encounter for immunization: Secondary | ICD-10-CM | POA: Diagnosis not present

## 2020-01-10 DIAGNOSIS — I10 Essential (primary) hypertension: Secondary | ICD-10-CM | POA: Diagnosis not present

## 2020-01-10 DIAGNOSIS — E782 Mixed hyperlipidemia: Secondary | ICD-10-CM | POA: Diagnosis not present

## 2020-01-10 DIAGNOSIS — E1121 Type 2 diabetes mellitus with diabetic nephropathy: Secondary | ICD-10-CM | POA: Diagnosis not present

## 2020-01-14 DIAGNOSIS — I10 Essential (primary) hypertension: Secondary | ICD-10-CM | POA: Diagnosis not present

## 2020-01-14 DIAGNOSIS — E1142 Type 2 diabetes mellitus with diabetic polyneuropathy: Secondary | ICD-10-CM | POA: Diagnosis not present

## 2020-01-14 DIAGNOSIS — E782 Mixed hyperlipidemia: Secondary | ICD-10-CM | POA: Diagnosis not present

## 2020-01-14 DIAGNOSIS — E1121 Type 2 diabetes mellitus with diabetic nephropathy: Secondary | ICD-10-CM | POA: Diagnosis not present

## 2020-01-20 DIAGNOSIS — L602 Onychogryphosis: Secondary | ICD-10-CM | POA: Diagnosis not present

## 2020-01-20 DIAGNOSIS — L84 Corns and callosities: Secondary | ICD-10-CM | POA: Diagnosis not present

## 2020-01-20 DIAGNOSIS — E1151 Type 2 diabetes mellitus with diabetic peripheral angiopathy without gangrene: Secondary | ICD-10-CM | POA: Diagnosis not present

## 2020-01-21 DIAGNOSIS — M7061 Trochanteric bursitis, right hip: Secondary | ICD-10-CM | POA: Diagnosis not present

## 2020-01-21 DIAGNOSIS — M549 Dorsalgia, unspecified: Secondary | ICD-10-CM | POA: Diagnosis not present

## 2020-01-21 DIAGNOSIS — M150 Primary generalized (osteo)arthritis: Secondary | ICD-10-CM | POA: Diagnosis not present

## 2020-01-21 DIAGNOSIS — M1612 Unilateral primary osteoarthritis, left hip: Secondary | ICD-10-CM | POA: Diagnosis not present

## 2020-01-21 DIAGNOSIS — M25551 Pain in right hip: Secondary | ICD-10-CM | POA: Diagnosis not present

## 2020-01-21 DIAGNOSIS — M160 Bilateral primary osteoarthritis of hip: Secondary | ICD-10-CM | POA: Diagnosis not present

## 2020-02-03 ENCOUNTER — Other Ambulatory Visit: Payer: Self-pay

## 2020-02-03 ENCOUNTER — Ambulatory Visit: Payer: Medicare Other | Admitting: Podiatry

## 2020-02-03 DIAGNOSIS — M2141 Flat foot [pes planus] (acquired), right foot: Secondary | ICD-10-CM

## 2020-02-03 DIAGNOSIS — E0843 Diabetes mellitus due to underlying condition with diabetic autonomic (poly)neuropathy: Secondary | ICD-10-CM | POA: Diagnosis not present

## 2020-02-03 DIAGNOSIS — M19079 Primary osteoarthritis, unspecified ankle and foot: Secondary | ICD-10-CM | POA: Diagnosis not present

## 2020-02-03 DIAGNOSIS — M2142 Flat foot [pes planus] (acquired), left foot: Secondary | ICD-10-CM | POA: Diagnosis not present

## 2020-02-03 NOTE — Progress Notes (Signed)
   HPI: 75 y.o. female presenting today as a reestablish new patient for evaluation of diabetic foot exam and for possible new diabetic shoes and insoles.  Patient has been here in the past where she has gotten diabetic shoes and insoles.  Patient is diabetic and is concerned for foot health.  She presents for further treatment and evaluation  Past Medical History:  Diagnosis Date  . Diabetes mellitus   . GERD (gastroesophageal reflux disease)   . Glaucoma   . Glaucoma   . Hypercholesteremia   . Hypertension   . Obesity      Physical Exam: General: The patient is alert and oriented x3 in no acute distress.  Dermatology: Skin is warm, dry and supple bilateral lower extremities. Negative for open lesions or macerations.  Vascular: Palpable pedal pulses bilaterally. No edema or erythema noted. Capillary refill within normal limits.  Neurological: Epicritic and protective threshold diminished bilaterally.   Musculoskeletal Exam: Pes planus noted with medial longitudinal arch collapse bilateral.  No other significant pedal deformities noted. Muscle strength 5/5 in all groups bilateral.    Assessment: 1.  Diabetes mellitus with peripheral polyneuropathy 2.  Pes planus bilateral with DJD   Plan of Care:  1. Patient evaluated.  2.  Comprehensive diabetic foot evaluation performed today.  No pedal complaints or concerns at the moment 3.  Continue wearing current diabetic shoes and insoles. 4.  Appointment with Pedorthist for custom molded insoles and diabetic shoes 5.  Return to clinic as needed      Felecia Shelling, DPM [facility]  Dr. Felecia Shelling, DPM    [address] [place], [place] [address]                Office [phone]  Fax [phone]

## 2020-02-10 DIAGNOSIS — H401132 Primary open-angle glaucoma, bilateral, moderate stage: Secondary | ICD-10-CM | POA: Diagnosis not present

## 2020-02-10 DIAGNOSIS — E119 Type 2 diabetes mellitus without complications: Secondary | ICD-10-CM | POA: Diagnosis not present

## 2020-02-10 DIAGNOSIS — H2512 Age-related nuclear cataract, left eye: Secondary | ICD-10-CM | POA: Diagnosis not present

## 2020-02-13 DIAGNOSIS — E1122 Type 2 diabetes mellitus with diabetic chronic kidney disease: Secondary | ICD-10-CM | POA: Diagnosis not present

## 2020-02-13 DIAGNOSIS — E785 Hyperlipidemia, unspecified: Secondary | ICD-10-CM | POA: Diagnosis not present

## 2020-02-13 DIAGNOSIS — I129 Hypertensive chronic kidney disease with stage 1 through stage 4 chronic kidney disease, or unspecified chronic kidney disease: Secondary | ICD-10-CM | POA: Diagnosis not present

## 2020-02-13 DIAGNOSIS — G8929 Other chronic pain: Secondary | ICD-10-CM | POA: Diagnosis not present

## 2020-02-13 DIAGNOSIS — N1831 Chronic kidney disease, stage 3a: Secondary | ICD-10-CM | POA: Diagnosis not present

## 2020-02-18 ENCOUNTER — Ambulatory Visit: Attending: Orthopaedic Surgery

## 2020-02-18 ENCOUNTER — Other Ambulatory Visit: Payer: Self-pay | Admitting: Internal Medicine

## 2020-02-18 ENCOUNTER — Encounter

## 2020-02-18 ENCOUNTER — Encounter: Admit: 2020-02-18

## 2020-02-18 ENCOUNTER — Ambulatory Visit: Admit: 2020-02-18 | Discharge: 2020-02-18 | Payer: MEDICARE | Attending: Orthopaedic Surgery

## 2020-02-18 DIAGNOSIS — M79602 Pain in left arm: Secondary | ICD-10-CM

## 2020-02-18 DIAGNOSIS — M25522 Pain in left elbow: Secondary | ICD-10-CM

## 2020-02-18 DIAGNOSIS — N1832 Chronic kidney disease, stage 3b: Secondary | ICD-10-CM

## 2020-02-18 MED ORDER — LIDOCAINE HCL 1 % (10 MG/ML) IJ SOLN
10 mg/mL (1 %) | Freq: Once | INTRAMUSCULAR | Status: AC
Start: 2020-02-18 — End: 2020-02-18
  Administered 2020-02-18: 17:00:00

## 2020-02-18 MED ORDER — TRIAMCINOLONE ACETONIDE 40 MG/ML SUSP FOR INJECTION
40 mg/mL | Freq: Once | INTRAMUSCULAR | Status: AC
Start: 2020-02-18 — End: 2020-02-18
  Administered 2020-02-18: 17:00:00

## 2020-02-18 NOTE — Progress Notes (Signed)
Name: Deborah Ingram    DOB: 16-Jan-1946     Service Dept: Sondra Barges [facility]    Chief Complaint   Patient presents with   ??? Arm Pain        Visit Vitals  Ht 5\' 4"  (1.626 m)   Wt 135 lb (61.2 kg)   BMI 23.17 kg/m??        Allergies   Allergen Reactions   ??? Aspirin Unknown (comments)     GI symptoms - ok to take     ??? Codeine Unknown (comments)        Current Outpatient Medications   Medication Sig Dispense Refill   ??? famotidine (PEPCID) 20 mg tablet Take 20 mg by mouth two (2) times a day.     ??? levothyroxine (SYNTHROID) 25 mcg tablet TAKE 2 TABLETS ONCE DAILY     ??? losartan-hydroCHLOROthiazide (HYZAAR) 50-12.5 mg per tablet Take 1 Tablet by mouth daily.       Current Facility-Administered Medications   Medication Dose Route Frequency Provider Last Rate Last Admin   ??? [COMPLETED] triamcinolone acetonide (KENALOG-40) 40 mg/mL injection 40 mg  40 mg Other ONCE Janayah Zavada A, MD   40 mg at 02/18/20 1200   ??? [COMPLETED] lidocaine (XYLOCAINE) 10 mg/mL (1 %) injection 1 mL  1 mL Other ONCE Ahmya Bernick A, MD   1 mL at 02/18/20 1200      There is no problem list on file for this patient.     Family History   Problem Relation Age of Onset   ??? Cancer Sister       Social History     Socioeconomic History   ??? Marital status: WIDOWED   Tobacco Use   ??? Smoking status: Never Smoker   ??? Smokeless tobacco: Never Used   Substance and Sexual Activity   ??? Alcohol use: Never      History reviewed. No pertinent surgical history.   Past Medical History:   Diagnosis Date   ??? Heart disease    ??? Hypertension    ??? Thyroid disease         I have reviewed and agree with PFSH and ROS and intake form in chart and the record furthermore I have reviewed prior medical record(s) regarding this patients care during this appointment.     Review of Systems:   Patient is a pleasant appearing individual, appropriately dressed, well hydrated, well nourished, who is alert, appropriately oriented for age, and in no  acute distress with a normal gait and normal affect who does not appear to be in any significant pain.   Physical Exam:  Left Elbow- Full Range of motion, Grossly neurovascularly intact, No instabililty, Positive extension test with weakness, Mild swelling, Point tenderness on the lateral epicondyle, No crepitation, No skin rashes or lesions identified.     Right Elbow- Full Range of motion, No point tenderness, No instability, Normal Strength, No skin lesions, No swelling, Grossly neurovascularly intact.    Procedure Documentation:    I discussed in detail the risks, benefits and complications of an injection which included but are not limited to infection, skin reactions, hot swollen joint, and anaphylaxis with the patient. The patient verbalized understanding and gave informed consent for the injection. The patient's left elbow was prepped using sterile alcohol solution. A sterile needle was inserted into the left elbow and the mixture of 1 mL Lidocaine 1%, 1 mL Kenalog 40 mg was injected under sterile  technique. The needle was withdrawn and the puncture site sealed with a Band-Aid.      Technique: Under sterile conditions a GE ultrasound unit with a variable frequency (7.0-14.0 MHz) linear transducer was used to localize the placement of needle into the left elbow joint.    Findings: Successful needle placement for elbow injection.  Final images were taken and saved for permanent record.      The patient tolerated the injection well. The patient was instructed to call the office immediately if there is any pain, redness, warmth, fever, or chills.   Encounter Diagnoses     ICD-10-CM ICD-9-CM   1. Left elbow pain  M25.522 719.42   2. Olecranon bursitis of left elbow  M70.22 726.33       HPI:  The patient is here with a chief complaint of left elbow pain, sharp, throbbing pain, progressively getting worse.  Pain is 7/10.    X-rays of the left forearm are unremarkable.    Assessment/Plan:  1.  Left elbow lateral  epicondylitis.    Plan will be for cortisone injection to the left elbow.  If it helps, it is all we need to do and go from there.      As part of continued conservative pain management options the patient was advised to utilize Tylenol or OTC NSAIDS as long as it is not medically contraindicated.     Return to Office:   Follow-up and Dispositions    ?? Return in about 2 weeks (around 03/03/2020).        Administrations This Visit     lidocaine (XYLOCAINE) 10 mg/mL (1 %) injection 1 mL     Admin Date  02/18/2020 Action  Given Dose  1 mL Route  Other Administered By  Diona Foley, LPN          triamcinolone acetonide (KENALOG-40) 40 mg/mL injection 40 mg     Admin Date  02/18/2020 Action  Given Dose  40 mg Route  Other Administered By  Diona Foley, LPN               Scribed by Diona Foley, LPN as dictated by [facility] A. Allena Katz, MD.  Documentation True and Accepted Avryl Roehm A. Allena Katz, MD

## 2020-02-24 ENCOUNTER — Other Ambulatory Visit: Payer: Self-pay

## 2020-02-24 ENCOUNTER — Ambulatory Visit: Payer: Medicare Other | Admitting: Orthotics

## 2020-02-24 DIAGNOSIS — M2141 Flat foot [pes planus] (acquired), right foot: Secondary | ICD-10-CM

## 2020-02-24 DIAGNOSIS — M2142 Flat foot [pes planus] (acquired), left foot: Secondary | ICD-10-CM

## 2020-02-24 DIAGNOSIS — E0843 Diabetes mellitus due to underlying condition with diabetic autonomic (poly)neuropathy: Secondary | ICD-10-CM

## 2020-02-24 NOTE — Progress Notes (Signed)

## 2020-03-03 ENCOUNTER — Ambulatory Visit
Admission: RE | Admit: 2020-03-03 | Discharge: 2020-03-03 | Disposition: A | Discharge status: Home / Self Care | Payer: Medicare Other | Source: Ambulatory Visit | Attending: Internal Medicine | Admitting: Internal Medicine

## 2020-03-03 ENCOUNTER — Encounter: Attending: Orthopaedic Surgery

## 2020-03-03 ENCOUNTER — Encounter: Payer: MEDICARE | Attending: Orthopaedic Surgery

## 2020-03-03 DIAGNOSIS — N261 Atrophy of kidney (terminal): Secondary | ICD-10-CM | POA: Diagnosis not present

## 2020-03-03 DIAGNOSIS — N1832 Chronic kidney disease, stage 3b: Secondary | ICD-10-CM

## 2020-03-04 ENCOUNTER — Other Ambulatory Visit: Payer: Self-pay | Admitting: Internal Medicine

## 2020-03-04 DIAGNOSIS — Z1231 Encounter for screening mammogram for malignant neoplasm of breast: Secondary | ICD-10-CM

## 2020-03-09 ENCOUNTER — Other Ambulatory Visit: Payer: Self-pay | Admitting: Internal Medicine

## 2020-03-09 DIAGNOSIS — I129 Hypertensive chronic kidney disease with stage 1 through stage 4 chronic kidney disease, or unspecified chronic kidney disease: Secondary | ICD-10-CM

## 2020-03-17 ENCOUNTER — Other Ambulatory Visit: Payer: Self-pay | Admitting: Internal Medicine

## 2020-03-17 ENCOUNTER — Ambulatory Visit
Admission: RE | Admit: 2020-03-17 | Discharge: 2020-03-17 | Disposition: A | Discharge status: Home / Self Care | Payer: Medicare Other | Source: Ambulatory Visit | Attending: Internal Medicine | Admitting: Internal Medicine

## 2020-03-17 DIAGNOSIS — I129 Hypertensive chronic kidney disease with stage 1 through stage 4 chronic kidney disease, or unspecified chronic kidney disease: Secondary | ICD-10-CM

## 2020-03-17 DIAGNOSIS — I10 Essential (primary) hypertension: Secondary | ICD-10-CM | POA: Diagnosis not present

## 2020-03-17 DIAGNOSIS — N261 Atrophy of kidney (terminal): Secondary | ICD-10-CM | POA: Diagnosis not present

## 2020-03-23 ENCOUNTER — Other Ambulatory Visit: Payer: Medicare Other | Admitting: Orthotics

## 2020-03-23 ENCOUNTER — Telehealth: Payer: Self-pay | Admitting: Podiatry

## 2020-03-23 NOTE — Telephone Encounter (Signed)
Received voicemail from pt about diabetic shoes asking for a call back.  Returned call and spoke to pt she said Dr Pennie Banter office had not received the paperwork. And they gave her a number to fax it to.  Upon verification we had the correct number for Dr Shelia Media and I faxed it by hand on 2.16.2022 after we had talked. She asked if I could fax it again attention to [employer] .And I did. I also mailed a copy to the pt with the confirmations as well.

## 2020-03-24 ENCOUNTER — Other Ambulatory Visit: Payer: Self-pay | Admitting: Internal Medicine

## 2020-03-24 DIAGNOSIS — I129 Hypertensive chronic kidney disease with stage 1 through stage 4 chronic kidney disease, or unspecified chronic kidney disease: Secondary | ICD-10-CM

## 2020-03-26 DIAGNOSIS — M79671 Pain in right foot: Secondary | ICD-10-CM | POA: Diagnosis not present

## 2020-03-26 DIAGNOSIS — M2142 Flat foot [pes planus] (acquired), left foot: Secondary | ICD-10-CM | POA: Diagnosis not present

## 2020-03-26 DIAGNOSIS — M21961 Unspecified acquired deformity of right lower leg: Secondary | ICD-10-CM | POA: Diagnosis not present

## 2020-03-26 DIAGNOSIS — E1121 Type 2 diabetes mellitus with diabetic nephropathy: Secondary | ICD-10-CM | POA: Diagnosis not present

## 2020-03-26 DIAGNOSIS — M2141 Flat foot [pes planus] (acquired), right foot: Secondary | ICD-10-CM | POA: Diagnosis not present

## 2020-03-26 DIAGNOSIS — M79672 Pain in left foot: Secondary | ICD-10-CM | POA: Diagnosis not present

## 2020-03-26 DIAGNOSIS — M21962 Unspecified acquired deformity of left lower leg: Secondary | ICD-10-CM | POA: Diagnosis not present

## 2020-04-02 ENCOUNTER — Encounter: Payer: Self-pay | Admitting: Endocrinology

## 2020-04-02 ENCOUNTER — Other Ambulatory Visit: Payer: Self-pay

## 2020-04-02 ENCOUNTER — Ambulatory Visit: Payer: Medicare Other | Admitting: Endocrinology

## 2020-04-02 LAB — BASIC METABOLIC PANEL
BUN: 24 mg/dL — ABNORMAL HIGH (ref 6–23)
CO2: 28 mEq/L (ref 19–32)
Calcium: 10.2 mg/dL (ref 8.4–10.5)
Chloride: 103 mEq/L (ref 96–112)
Creatinine, Ser: 1.42 mg/dL — ABNORMAL HIGH (ref 0.40–1.20)
GFR: 36.99 mL/min — ABNORMAL LOW (ref >60.00)
Glucose, Bld: 172 mg/dL — ABNORMAL HIGH (ref 70–99)
Potassium: 3.7 mEq/L (ref 3.5–5.1)
Sodium: 140 mEq/L (ref 135–145)

## 2020-04-02 LAB — VITAMIN D 25 HYDROXY (VIT D DEFICIENCY, FRACTURES): VITD: 42.93 ng/mL (ref 30.00–100.00)

## 2020-04-02 NOTE — Patient Instructions (Addendum)
Blood tests are requested for you today.  We'll let you know about the results.  If these these do not find a cause, the next step is to see Dr Garnet Koyanagi to follow this up.

## 2020-04-02 NOTE — Progress Notes (Signed)
Subjective:    Patient ID: [name], female    DOB: 05/01/1945, 75 y.o.   MRN: [id number]  HPI Pt is referred by Dr Joylene Grapes, for hypercalcemia.  Pt was noted to have hypercalcemia in 2021.  she has never had osteoporosis, sarcoidosis, cancer, PUD, pancreatitis, or bony fracture.  He does not take vitamin-D or A supplements.  Pt denies taking antacids, Li++, or HCTZ.  She had urolithiasis in 2000.  Main symptom is low back pain.  Pt says she might have had 24-HR urine calcium at [facility].   Past Medical History:  Diagnosis Date  . Diabetes mellitus   . GERD (gastroesophageal reflux disease)   . Glaucoma   . Glaucoma   . Hypercholesteremia   . Hypertension   . Obesity     Past Surgical History:  Procedure Laterality Date  . ABDOMINAL HYSTERECTOMY    . BREAST EXCISIONAL BIOPSY Left    benign  . EYE SURGERY    . TONSILLECTOMY      Social History   Socioeconomic History  . Marital status: Married    Spouse name: Not on file  . Number of children: Not on file  . Years of education: Not on file  . Highest education level: Not on file  Occupational History  . Not on file  Tobacco Use  . Smoking status: Never Smoker  . Smokeless tobacco: Never Used  Substance and Sexual Activity  . Alcohol use: No  . Drug use: No  . Sexual activity: Not on file  Other Topics Concern  . Not on file  Social History Narrative  . Not on file   Social Determinants of Health   Financial Resource Strain: Not on file  Food Insecurity: Not on file  Transportation Needs: Not on file  Physical Activity: Not on file  Stress: Not on file  Social Connections: Not on file  Intimate Partner Violence: Not on file    Current Outpatient Medications on File Prior to Visit  Medication Sig Dispense Refill  . amLODipine-valsartan (EXFORGE) 10-320 MG per tablet Take 1 tablet by mouth daily.    Marland Kitchen aspirin EC 81 MG tablet Take 81 mg by mouth daily.    Marland Kitchen atorvastatin (LIPITOR) 20 MG tablet Take 20 mg  by mouth daily.    . bimatoprost (LUMIGAN) 0.03 % ophthalmic solution Place 1 drop into both eyes at bedtime.    . brimonidine-timolol (COMBIGAN) 0.2-0.5 % ophthalmic solution Place 1 drop into both eyes every 12 (twelve) hours.    . brinzolamide (AZOPT) 1 % ophthalmic suspension Place 1 drop into both eyes 3 (three) times daily.    . cloNIDine (CATAPRES) 0.1 MG tablet Take 1 tablet by mouth 2 (two) times daily.    . cyclobenzaprine (FLEXERIL) 5 MG tablet Take 1 tablet (5 mg total) by mouth 3 (three) times daily as needed for muscle spasms. 30 tablet 0  . esomeprazole (NEXIUM) 40 MG capsule Take 40 mg by mouth daily as needed. Acid reflux    . glipiZIDE (GLUCOTROL XL) 5 MG 24 hr tablet Take 5 mg by mouth daily.    . hydrocortisone (ANUSOL-HC) 2.5 % rectal cream Apply rectally 2 times daily 28.35 g 0  . ibuprofen (ADVIL,MOTRIN) 600 MG tablet Take 1 tablet (600 mg total) by mouth every 6 (six) hours as needed. 30 tablet 0  . irbesartan (AVAPRO) 300 MG tablet Take 1 tablet by mouth daily.    . meloxicam (MOBIC) 15 MG tablet Take 15 mg  by mouth daily as needed. pain    . metFORMIN (GLUCOPHAGE) 500 MG tablet Take 500 mg by mouth 2 (two) times daily with a meal.    . nebivolol (BYSTOLIC) 5 MG tablet Take 10 mg by mouth daily.     Marland Kitchen oxyCODONE-acetaminophen (PERCOCET/ROXICET) 5-325 MG per tablet Take 1 tablet by mouth every 6 (six) hours as needed for severe pain. 6 tablet 0  . polyethylene glycol (MIRALAX / GLYCOLAX) packet Take 17 g by mouth daily. 14 each 0  . pregabalin (LYRICA) 50 MG capsule Take 50 mg by mouth at bedtime.    . torsemide (DEMADEX) 20 MG tablet Take 1 tablet by mouth daily.    . vitamin E 400 UNIT capsule Take 400 Units by mouth daily.     No current facility-administered medications on file prior to visit.    Allergies  Allergen Reactions  . Methazolamide Rash  . Sulfa Antibiotics Other (See Comments)    PATIENT HAS TO MONITOR THE USE OF ANY SULFA PRODUCTS.  IF IT TOUCHES HER  SCALP IT CAUSES HER GREAT PAIN AND HEAT.  NOT SURE OF OTHER REACTIONS THAT SHE HAS. Severe head pain with hair products   . Hydrocodone-Acetaminophen     Out of body experience  . Sulfonamide Derivatives     Severe head pain with hair products    Family History  Problem Relation Age of Onset  . Cancer Other   . Hyperlipidemia Other   . Breast cancer Maternal Aunt   . Hypercalcemia Neg Hx     BP (!) 170/90 (BP Location: Right Arm, Patient Position: Sitting, Cuff Size: Large)   Pulse 79   Ht 5\' 3"  (1.6 m)   Wt 221 lb 9.6 oz (100.5 kg)   SpO2 97%   BMI 39.25 kg/m    Review of Systems Denies weight loss, visual loss, polyuria, depression, and numbness.         Objective:   Physical Exam VS: see vs page GEN: no distress HEAD: head: no deformity eyes: no periorbital swelling, no proptosis external nose and ears are normal NECK: supple, thyroid is not enlarged CHEST WALL: no kyphosis LUNGS: clear to auscultation CV: reg rate and rhythm, no murmur.  MUSCULOSKELETAL: gait is normal and steady EXTEMITIES: no deformity.  1+ bilat leg edema NEURO:  readily moves all 4's.  sensation is intact to touch on all 4's SKIN:  Normal texture and temperature.  No rash or suspicious lesion is visible.   NODES:  None palpable at the neck PSYCH: alert, well-oriented.  Does not appear anxious nor depressed.  outside test results are reviewed: TSH=0.9 Ca++=10.8 Creat=0.8 25-OH Vit-D=36  I have reviewed outside records, and summarized: Pt was noted to have elevated Ca++, and referred here.  HTN and dyslipidemia were also addressed      Assessment & Plan:  Hypercalcemia, new to me.  uncertain etiology and prognosis.  Pt requests for labs to be done here, despite the fact that she might have had w/u of this already at [facility].    Patient Instructions  Blood tests are requested for you today.  We'll let you know about the results.  If these these do not find a cause, the next step is to  see Dr Garnet Koyanagi to follow this up.

## 2020-04-06 DIAGNOSIS — H401132 Primary open-angle glaucoma, bilateral, moderate stage: Secondary | ICD-10-CM | POA: Diagnosis not present

## 2020-04-09 ENCOUNTER — Ambulatory Visit
Admission: RE | Admit: 2020-04-09 | Discharge: 2020-04-09 | Disposition: A | Discharge status: Home / Self Care | Payer: Medicare Other | Source: Ambulatory Visit | Attending: Internal Medicine | Admitting: Internal Medicine

## 2020-04-09 DIAGNOSIS — I129 Hypertensive chronic kidney disease with stage 1 through stage 4 chronic kidney disease, or unspecified chronic kidney disease: Secondary | ICD-10-CM

## 2020-04-09 DIAGNOSIS — K449 Diaphragmatic hernia without obstruction or gangrene: Secondary | ICD-10-CM | POA: Diagnosis not present

## 2020-04-09 DIAGNOSIS — K5730 Diverticulosis of large intestine without perforation or abscess without bleeding: Secondary | ICD-10-CM | POA: Diagnosis not present

## 2020-04-09 DIAGNOSIS — K429 Umbilical hernia without obstruction or gangrene: Secondary | ICD-10-CM | POA: Diagnosis not present

## 2020-04-09 DIAGNOSIS — I701 Atherosclerosis of renal artery: Secondary | ICD-10-CM | POA: Diagnosis not present

## 2020-04-09 LAB — VITAMIN A: Vitamin A (Retinoic Acid): 66 ug/dL (ref 38–98)

## 2020-04-09 LAB — VITAMIN D 1,25 DIHYDROXY
Vitamin D 1, 25 (OH)2 Total: 28 pg/mL (ref 18–72)
Vitamin D2 1, 25 (OH)2: <8 pg/mL
Vitamin D3 1, 25 (OH)2: 28 pg/mL

## 2020-04-09 LAB — PTH, INTACT AND CALCIUM
Calcium: 10.2 mg/dL (ref 8.6–10.4)
PTH: 79 pg/mL — ABNORMAL HIGH (ref 14–64)

## 2020-04-09 LAB — PTH-RELATED PEPTIDE: PTH-Related Protein (PTH-RP): 11 pg/mL (ref 11–20)

## 2020-04-09 MED ORDER — IOPAMIDOL (ISOVUE-370) INJECTION 76%
60.0000 mL | Freq: Once | INTRAVENOUS | Status: AC | PRN
  Administered 2020-04-09: 60 mL via INTRAVENOUS

## 2020-04-20 ENCOUNTER — Other Ambulatory Visit: Payer: Self-pay

## 2020-04-20 ENCOUNTER — Ambulatory Visit
Admission: RE | Admit: 2020-04-20 | Discharge: 2020-04-20 | Disposition: A | Discharge status: Home / Self Care | Payer: Medicare Other | Source: Ambulatory Visit | Attending: Internal Medicine | Admitting: Internal Medicine

## 2020-04-20 ENCOUNTER — Ambulatory Visit (INDEPENDENT_AMBULATORY_CARE_PROVIDER_SITE_OTHER): Payer: Medicare Other | Admitting: *Deleted

## 2020-04-20 DIAGNOSIS — M2142 Flat foot [pes planus] (acquired), left foot: Secondary | ICD-10-CM

## 2020-04-20 DIAGNOSIS — M19079 Primary osteoarthritis, unspecified ankle and foot: Secondary | ICD-10-CM | POA: Diagnosis not present

## 2020-04-20 DIAGNOSIS — M2141 Flat foot [pes planus] (acquired), right foot: Secondary | ICD-10-CM

## 2020-04-20 DIAGNOSIS — Z1231 Encounter for screening mammogram for malignant neoplasm of breast: Secondary | ICD-10-CM | POA: Diagnosis not present

## 2020-04-20 DIAGNOSIS — E0843 Diabetes mellitus due to underlying condition with diabetic autonomic (poly)neuropathy: Secondary | ICD-10-CM | POA: Diagnosis not present

## 2020-04-20 NOTE — Progress Notes (Signed)
Patient presents today to pick up diabetic shoes and insoles.  Patient was dispensed 1 pair of diabetic shoes and 3 pairs of foam casted diabetic insoles. Fit was satisfactory. Instructions for break-in and wear was reviewed and a copy was given to the patient.   Re-appointment for regularly scheduled diabetic foot care visits or if they should experience any trouble with the shoes or insoles.  

## 2020-04-21 ENCOUNTER — Encounter: Payer: Self-pay | Admitting: Vascular Surgery

## 2020-04-21 ENCOUNTER — Ambulatory Visit: Payer: Medicare Other | Admitting: Vascular Surgery

## 2020-04-21 DIAGNOSIS — N1831 Chronic kidney disease, stage 3a: Secondary | ICD-10-CM | POA: Diagnosis not present

## 2020-04-21 DIAGNOSIS — N183 Chronic kidney disease, stage 3 unspecified: Secondary | ICD-10-CM | POA: Insufficient documentation

## 2020-04-21 NOTE — Progress Notes (Signed)
Patient name: [name] MRN: [id number] DOB: 11/23/1945 Sex: female  REASON FOR CONSULT: Evaluate for renal artery stenosis  HPI: [name] is a 75 y.o. female, with history hypertension, hyperlipidemia, diabetes, GERD, stage IIIa chronic kidney disease that presents for evaluation of possible renal artery stenosis.  Patient states that she had problems with her blood pressure at home and initially this was very high in the 180s but she is now on 4 medications including amlodipine, clonidine, irbesartan and nebivolol with much better results.  She states her blood pressure has been 100s to 130s over the last month and again much better controlled.  She gets a little nervous at the doctor's office and her pressure gets elevated during visits.  She has been evaluated by Dr. Joylene Grapes with nephrology who got a renal ultrasound that showed the right renal ostium with diminished flow and chronic right renal cortical atrophy but no identified stenosis.  Right kidney was small at 6.9 cm and left kidney normal size 12 cm.  CTA abdomen pelvis was obtained on 04/09/2020 that showed right renal artery plaque with chronic right renal atrophy and 2 left renal arteries with no stenosis.  Past Medical History:  Diagnosis Date  . Diabetes mellitus   . GERD (gastroesophageal reflux disease)   . Glaucoma   . Glaucoma   . Hypercholesteremia   . Hypertension   . Obesity     Past Surgical History:  Procedure Laterality Date  . ABDOMINAL HYSTERECTOMY    . BREAST EXCISIONAL BIOPSY Left    benign  . EYE SURGERY    . TONSILLECTOMY      Family History  Problem Relation Age of Onset  . Cancer Other   . Hyperlipidemia Other   . Breast cancer Maternal Aunt   . Hypercalcemia Neg Hx     SOCIAL HISTORY: Social History   Socioeconomic History  . Marital status: Married    Spouse name: Not on file  . Number of children: Not on file  . Years of education: Not on file  . Highest education  level: Not on file  Occupational History  . Not on file  Tobacco Use  . Smoking status: Never Smoker  . Smokeless tobacco: Never Used  Substance and Sexual Activity  . Alcohol use: No  . Drug use: No  . Sexual activity: Not on file  Other Topics Concern  . Not on file  Social History Narrative  . Not on file   Social Determinants of Health   Financial Resource Strain: Not on file  Food Insecurity: Not on file  Transportation Needs: Not on file  Physical Activity: Not on file  Stress: Not on file  Social Connections: Not on file  Intimate Partner Violence: Not on file    Allergies  Allergen Reactions  . Methazolamide Rash  . Sulfa Antibiotics Other (See Comments)    PATIENT HAS TO MONITOR THE USE OF ANY SULFA PRODUCTS.  IF IT TOUCHES HER SCALP IT CAUSES HER GREAT PAIN AND HEAT.  NOT SURE OF OTHER REACTIONS THAT SHE HAS. Severe head pain with hair products   . Hydrocodone-Acetaminophen     Out of body experience  . Sulfonamide Derivatives     Severe head pain with hair products    Current Outpatient Medications  Medication Sig Dispense Refill  . aspirin EC 81 MG tablet Take 81 mg by mouth daily.    . brimonidine-timolol (COMBIGAN) 0.2-0.5 % ophthalmic solution Place 1 drop into both  eyes every 12 (twelve) hours.    . cloNIDine (CATAPRES) 0.1 MG tablet Take 1 tablet by mouth 2 (two) times daily.    Marland Kitchen esomeprazole (NEXIUM) 40 MG capsule Take 40 mg by mouth daily as needed. Acid reflux    . hydrocortisone (ANUSOL-HC) 2.5 % rectal cream Apply rectally 2 times daily 28.35 g 0  . ibuprofen (ADVIL,MOTRIN) 600 MG tablet Take 1 tablet (600 mg total) by mouth every 6 (six) hours as needed. 30 tablet 0  . irbesartan (AVAPRO) 300 MG tablet Take 1 tablet by mouth daily.    . metFORMIN (GLUCOPHAGE) 500 MG tablet Take 500 mg by mouth 2 (two) times daily with a meal.    . nebivolol (BYSTOLIC) 5 MG tablet Take 10 mg by mouth daily.     . polyethylene glycol (MIRALAX / GLYCOLAX) packet  Take 17 g by mouth daily. 14 each 0  . pregabalin (LYRICA) 50 MG capsule Take 50 mg by mouth at bedtime.    . torsemide (DEMADEX) 20 MG tablet Take 1 tablet by mouth daily.    Marland Kitchen amLODipine-valsartan (EXFORGE) 10-320 MG per tablet Take 1 tablet by mouth daily.    Marland Kitchen atorvastatin (LIPITOR) 20 MG tablet Take 20 mg by mouth daily.    . bimatoprost (LUMIGAN) 0.03 % ophthalmic solution Place 1 drop into both eyes at bedtime.    . brinzolamide (AZOPT) 1 % ophthalmic suspension Place 1 drop into both eyes 3 (three) times daily.    . cyclobenzaprine (FLEXERIL) 5 MG tablet Take 1 tablet (5 mg total) by mouth 3 (three) times daily as needed for muscle spasms. 30 tablet 0  . glipiZIDE (GLUCOTROL XL) 5 MG 24 hr tablet Take 5 mg by mouth daily.    . meloxicam (MOBIC) 15 MG tablet Take 15 mg by mouth daily as needed. pain    . oxyCODONE-acetaminophen (PERCOCET/ROXICET) 5-325 MG per tablet Take 1 tablet by mouth every 6 (six) hours as needed for severe pain. 6 tablet 0  . vitamin E 400 UNIT capsule Take 400 Units by mouth daily.     No current facility-administered medications for this visit.    REVIEW OF SYSTEMS:  [X]  denotes positive finding, [ ]  denotes negative finding Cardiac  Comments:  Chest pain or chest pressure:    Shortness of breath upon exertion:    Short of breath when lying flat:    Irregular heart rhythm:        Vascular    Pain in calf, thigh, or hip brought on by ambulation:    Pain in feet at night that wakes you up from your sleep:     Blood clot in your veins:    Leg swelling:         Pulmonary    Oxygen at home:    Productive cough:     Wheezing:         Neurologic    Sudden weakness in arms or legs:     Sudden numbness in arms or legs:     Sudden onset of difficulty speaking or slurred speech:    Temporary loss of vision in one eye:     Problems with dizziness:         Gastrointestinal    Blood in stool:     Vomited blood:         Genitourinary    Burning when  urinating:     Blood in urine:        Psychiatric  Major depression:         Hematologic    Bleeding problems:    Problems with blood clotting too easily:        Skin    Rashes or ulcers:        Constitutional    Fever or chills:      PHYSICAL EXAM: Vitals:   04/21/20 1247  BP: (!) 165/96  Pulse: 73  Resp: 16  Temp: 97.8 F (36.6 C)  TempSrc: Temporal  SpO2: 98%  Weight: 217 lb (98.4 kg)  Height: 5\' 3"  (1.6 m)    GENERAL: The patient is a well-nourished female, in no acute distress. The vital signs are documented above. CARDIAC: There is a regular rate and rhythm.  VASCULAR:  Radial pulses palpable both upper extremities Femoral pulses palpable both groins PULMONARY: No respiratory distress. ABDOMEN: Soft and non-tender. MUSCULOSKELETAL: There are no major deformities or cyanosis. NEUROLOGIC: No focal weakness or paresthesias are detected. SKIN: There are no ulcers or rashes noted. PSYCHIATRIC: The patient has a normal affect.  DATA:   I reviewed her renal artery duplex and there is no evidence of significant elevated velocities to suggest high-grade stenosis.  The right kidney is small and only measures 6.9 cm vs the left kidney measures 12 cm and normal size.  I reviewed CT abdomen pelvis and 2 left renal arteries that are both patent with no stenosis.  The left kidney is of normal size.  Right renal artery is very small and the right kidney is atrophied.  Assessment/Plan:  75 year old female with hypertension and stage III chronic kidney disease that presents for evaluation of suspected renal artery stenosis.  I reviewed with her and her husband that the ultrasound as well as her recent CT abdomen pelvis from Dr. Joylene Grapes shows a very small atrophied right kidney that only measures about 6.9 cm versus a normal size left kidney at 12 cm.  Given these findings, I do not think she would benefit from any surgical intervention and aside from some plaque at the right  renal artery origin no overt flow limiting stenosis on duplex.  I recommended ongoing medical management for blood pressure and kidney function.  Happy to see her in the future if questions or concerns arise.   Marty Heck, MD [facility] Office: [phone]

## 2020-04-23 DIAGNOSIS — E1151 Type 2 diabetes mellitus with diabetic peripheral angiopathy without gangrene: Secondary | ICD-10-CM | POA: Diagnosis not present

## 2020-04-23 DIAGNOSIS — L602 Onychogryphosis: Secondary | ICD-10-CM | POA: Diagnosis not present

## 2020-04-23 DIAGNOSIS — L84 Corns and callosities: Secondary | ICD-10-CM | POA: Diagnosis not present

## 2020-05-19 DIAGNOSIS — I10 Essential (primary) hypertension: Secondary | ICD-10-CM | POA: Diagnosis not present

## 2020-05-19 DIAGNOSIS — E118 Type 2 diabetes mellitus with unspecified complications: Secondary | ICD-10-CM | POA: Diagnosis not present

## 2020-05-26 DIAGNOSIS — E213 Hyperparathyroidism, unspecified: Secondary | ICD-10-CM | POA: Diagnosis not present

## 2020-05-26 DIAGNOSIS — N1831 Chronic kidney disease, stage 3a: Secondary | ICD-10-CM | POA: Diagnosis not present

## 2020-05-26 DIAGNOSIS — E782 Mixed hyperlipidemia: Secondary | ICD-10-CM | POA: Diagnosis not present

## 2020-05-26 DIAGNOSIS — E1142 Type 2 diabetes mellitus with diabetic polyneuropathy: Secondary | ICD-10-CM | POA: Diagnosis not present

## 2020-05-26 DIAGNOSIS — K219 Gastro-esophageal reflux disease without esophagitis: Secondary | ICD-10-CM | POA: Diagnosis not present

## 2020-05-26 DIAGNOSIS — N261 Atrophy of kidney (terminal): Secondary | ICD-10-CM | POA: Diagnosis not present

## 2020-05-26 DIAGNOSIS — E1121 Type 2 diabetes mellitus with diabetic nephropathy: Secondary | ICD-10-CM | POA: Diagnosis not present

## 2020-05-26 DIAGNOSIS — Z0001 Encounter for general adult medical examination with abnormal findings: Secondary | ICD-10-CM | POA: Diagnosis not present

## 2020-05-26 DIAGNOSIS — I10 Essential (primary) hypertension: Secondary | ICD-10-CM | POA: Diagnosis not present

## 2020-06-08 DIAGNOSIS — M79641 Pain in right hand: Secondary | ICD-10-CM | POA: Diagnosis not present

## 2020-06-08 DIAGNOSIS — M549 Dorsalgia, unspecified: Secondary | ICD-10-CM | POA: Diagnosis not present

## 2020-06-08 DIAGNOSIS — M19042 Primary osteoarthritis, left hand: Secondary | ICD-10-CM | POA: Diagnosis not present

## 2020-06-08 DIAGNOSIS — M79642 Pain in left hand: Secondary | ICD-10-CM | POA: Diagnosis not present

## 2020-06-08 DIAGNOSIS — M79601 Pain in right arm: Secondary | ICD-10-CM | POA: Diagnosis not present

## 2020-06-08 DIAGNOSIS — M150 Primary generalized (osteo)arthritis: Secondary | ICD-10-CM | POA: Diagnosis not present

## 2020-06-08 DIAGNOSIS — M8589 Other specified disorders of bone density and structure, multiple sites: Secondary | ICD-10-CM | POA: Diagnosis not present

## 2020-06-08 DIAGNOSIS — M19041 Primary osteoarthritis, right hand: Secondary | ICD-10-CM | POA: Diagnosis not present

## 2020-06-09 DIAGNOSIS — I10 Essential (primary) hypertension: Secondary | ICD-10-CM | POA: Diagnosis not present

## 2020-06-09 DIAGNOSIS — E782 Mixed hyperlipidemia: Secondary | ICD-10-CM | POA: Diagnosis not present

## 2020-06-09 DIAGNOSIS — E1121 Type 2 diabetes mellitus with diabetic nephropathy: Secondary | ICD-10-CM | POA: Diagnosis not present

## 2020-07-06 ENCOUNTER — Ambulatory Visit: Payer: Medicare Other | Admitting: Podiatry

## 2020-07-06 DIAGNOSIS — E119 Type 2 diabetes mellitus without complications: Secondary | ICD-10-CM | POA: Diagnosis not present

## 2020-07-06 DIAGNOSIS — H401132 Primary open-angle glaucoma, bilateral, moderate stage: Secondary | ICD-10-CM | POA: Diagnosis not present

## 2020-07-06 DIAGNOSIS — H2512 Age-related nuclear cataract, left eye: Secondary | ICD-10-CM | POA: Diagnosis not present

## 2020-07-07 DIAGNOSIS — M79601 Pain in right arm: Secondary | ICD-10-CM | POA: Diagnosis not present

## 2020-07-07 DIAGNOSIS — M549 Dorsalgia, unspecified: Secondary | ICD-10-CM | POA: Diagnosis not present

## 2020-07-07 DIAGNOSIS — M150 Primary generalized (osteo)arthritis: Secondary | ICD-10-CM | POA: Diagnosis not present

## 2020-07-07 DIAGNOSIS — M79641 Pain in right hand: Secondary | ICD-10-CM | POA: Diagnosis not present

## 2020-07-13 ENCOUNTER — Other Ambulatory Visit: Payer: Self-pay

## 2020-07-13 ENCOUNTER — Ambulatory Visit (INDEPENDENT_AMBULATORY_CARE_PROVIDER_SITE_OTHER): Payer: Medicare Other | Admitting: Podiatry

## 2020-07-13 DIAGNOSIS — M79674 Pain in right toe(s): Secondary | ICD-10-CM | POA: Diagnosis not present

## 2020-07-13 DIAGNOSIS — E0843 Diabetes mellitus due to underlying condition with diabetic autonomic (poly)neuropathy: Secondary | ICD-10-CM | POA: Diagnosis not present

## 2020-07-13 DIAGNOSIS — L989 Disorder of the skin and subcutaneous tissue, unspecified: Secondary | ICD-10-CM

## 2020-07-13 DIAGNOSIS — B351 Tinea unguium: Secondary | ICD-10-CM

## 2020-07-13 DIAGNOSIS — M79675 Pain in left toe(s): Secondary | ICD-10-CM

## 2020-07-14 DIAGNOSIS — M549 Dorsalgia, unspecified: Secondary | ICD-10-CM | POA: Diagnosis not present

## 2020-07-14 DIAGNOSIS — M79601 Pain in right arm: Secondary | ICD-10-CM | POA: Diagnosis not present

## 2020-07-14 DIAGNOSIS — M150 Primary generalized (osteo)arthritis: Secondary | ICD-10-CM | POA: Diagnosis not present

## 2020-07-14 DIAGNOSIS — M79641 Pain in right hand: Secondary | ICD-10-CM | POA: Diagnosis not present

## 2020-07-14 DIAGNOSIS — R202 Paresthesia of skin: Secondary | ICD-10-CM | POA: Diagnosis not present

## 2020-07-20 NOTE — Progress Notes (Signed)
   SUBJECTIVE Patient with a history of diabetes mellitus presents to office today for new complaint complaining of elongated, thickened nails that cause pain while ambulating in shoes.  She is unable to trim her own nails.   Patient also states that she has symptomatic preulcerative callus lesions to the bilateral feet that are very symptomatic with weightbearing and walking.  She has not done anything for treatment.  Patient is here for further evaluation and treatment.   Past Medical History:  Diagnosis Date   Diabetes mellitus    GERD (gastroesophageal reflux disease)    Glaucoma    Glaucoma    Hypercholesteremia    Hypertension    Obesity     OBJECTIVE General Patient is awake, alert, and oriented x 3 and in no acute distress. Derm Skin is dry and supple bilateral. Negative open lesions or macerations. Remaining integument unremarkable. Nails are tender, long, thickened and dystrophic with subungual debris, consistent with onychomycosis, 1-5 bilateral. No signs of infection noted.  Hyperkeratotic preulcerative callus lesions noted to the subfirst MTPJ bilateral Vasc  DP and PT pedal pulses palpable bilaterally. Temperature gradient within normal limits.  Neuro Epicritic and protective threshold sensation diminished bilaterally.  Musculoskeletal Exam No symptomatic pedal deformities noted bilateral. Muscular strength within normal limits.  ASSESSMENT 1. Diabetes Mellitus w/ peripheral neuropathy 2.  Pain due to onychomycosis of toenails bilateral 3.  Symptomatic preulcerative callus lesions bilateral subfirst MTPJ's  PLAN OF CARE 1. Patient evaluated today. 2. Instructed to maintain good pedal hygiene and foot care. Stressed importance of controlling blood sugar.  3. Mechanical debridement of nails 1-5 bilaterally performed using a nail nipper. Filed with dremel without incident.  4.  Excisional debridement of the hyperkeratotic preulcerative callus tissue was performed using a  312 scalpel without incident or bleeding  5.  Return to clinic in 3 mos. for routine foot care    Edrick Kins, DPM [facility]  Dr. Edrick Kins, DPM    [address], [place] [address]                Office [phone]  Fax [phone]

## 2020-08-14 DIAGNOSIS — R202 Paresthesia of skin: Secondary | ICD-10-CM | POA: Diagnosis not present

## 2020-08-14 DIAGNOSIS — M79641 Pain in right hand: Secondary | ICD-10-CM | POA: Diagnosis not present

## 2020-08-17 DIAGNOSIS — E119 Type 2 diabetes mellitus without complications: Secondary | ICD-10-CM | POA: Diagnosis not present

## 2020-08-17 DIAGNOSIS — H401132 Primary open-angle glaucoma, bilateral, moderate stage: Secondary | ICD-10-CM | POA: Diagnosis not present

## 2020-08-21 DIAGNOSIS — E782 Mixed hyperlipidemia: Secondary | ICD-10-CM | POA: Diagnosis not present

## 2020-08-21 DIAGNOSIS — E1121 Type 2 diabetes mellitus with diabetic nephropathy: Secondary | ICD-10-CM | POA: Diagnosis not present

## 2020-08-24 DIAGNOSIS — M5412 Radiculopathy, cervical region: Secondary | ICD-10-CM | POA: Diagnosis not present

## 2020-08-24 DIAGNOSIS — G5601 Carpal tunnel syndrome, right upper limb: Secondary | ICD-10-CM | POA: Diagnosis not present

## 2020-08-26 DIAGNOSIS — E785 Hyperlipidemia, unspecified: Secondary | ICD-10-CM | POA: Diagnosis not present

## 2020-08-26 DIAGNOSIS — I129 Hypertensive chronic kidney disease with stage 1 through stage 4 chronic kidney disease, or unspecified chronic kidney disease: Secondary | ICD-10-CM | POA: Diagnosis not present

## 2020-08-26 DIAGNOSIS — I701 Atherosclerosis of renal artery: Secondary | ICD-10-CM | POA: Diagnosis not present

## 2020-08-26 DIAGNOSIS — E1122 Type 2 diabetes mellitus with diabetic chronic kidney disease: Secondary | ICD-10-CM | POA: Diagnosis not present

## 2020-08-26 DIAGNOSIS — N1831 Chronic kidney disease, stage 3a: Secondary | ICD-10-CM | POA: Diagnosis not present

## 2020-08-27 DIAGNOSIS — E782 Mixed hyperlipidemia: Secondary | ICD-10-CM | POA: Diagnosis not present

## 2020-08-27 DIAGNOSIS — E1121 Type 2 diabetes mellitus with diabetic nephropathy: Secondary | ICD-10-CM | POA: Diagnosis not present

## 2020-08-27 DIAGNOSIS — I10 Essential (primary) hypertension: Secondary | ICD-10-CM | POA: Diagnosis not present

## 2020-09-03 DIAGNOSIS — M5124 Other intervertebral disc displacement, thoracic region: Secondary | ICD-10-CM | POA: Diagnosis not present

## 2020-09-03 DIAGNOSIS — M50221 Other cervical disc displacement at C4-C5 level: Secondary | ICD-10-CM | POA: Diagnosis not present

## 2020-09-03 DIAGNOSIS — M4802 Spinal stenosis, cervical region: Secondary | ICD-10-CM | POA: Diagnosis not present

## 2020-09-03 DIAGNOSIS — M47812 Spondylosis without myelopathy or radiculopathy, cervical region: Secondary | ICD-10-CM | POA: Diagnosis not present

## 2020-09-03 DIAGNOSIS — M4804 Spinal stenosis, thoracic region: Secondary | ICD-10-CM | POA: Diagnosis not present

## 2020-09-10 DIAGNOSIS — G5601 Carpal tunnel syndrome, right upper limb: Secondary | ICD-10-CM | POA: Diagnosis not present

## 2020-09-11 DIAGNOSIS — M7710 Lateral epicondylitis, unspecified elbow: Secondary | ICD-10-CM | POA: Diagnosis not present

## 2020-09-11 DIAGNOSIS — M79601 Pain in right arm: Secondary | ICD-10-CM | POA: Diagnosis not present

## 2020-09-11 DIAGNOSIS — M549 Dorsalgia, unspecified: Secondary | ICD-10-CM | POA: Diagnosis not present

## 2020-09-11 DIAGNOSIS — G5600 Carpal tunnel syndrome, unspecified upper limb: Secondary | ICD-10-CM | POA: Diagnosis not present

## 2020-09-11 DIAGNOSIS — R202 Paresthesia of skin: Secondary | ICD-10-CM | POA: Diagnosis not present

## 2020-09-11 DIAGNOSIS — M150 Primary generalized (osteo)arthritis: Secondary | ICD-10-CM | POA: Diagnosis not present

## 2020-09-11 DIAGNOSIS — M79641 Pain in right hand: Secondary | ICD-10-CM | POA: Diagnosis not present

## 2020-09-24 DIAGNOSIS — E119 Type 2 diabetes mellitus without complications: Secondary | ICD-10-CM | POA: Diagnosis not present

## 2020-09-24 DIAGNOSIS — H401132 Primary open-angle glaucoma, bilateral, moderate stage: Secondary | ICD-10-CM | POA: Diagnosis not present

## 2020-09-28 DIAGNOSIS — G5601 Carpal tunnel syndrome, right upper limb: Secondary | ICD-10-CM | POA: Diagnosis not present

## 2020-09-28 DIAGNOSIS — I10 Essential (primary) hypertension: Secondary | ICD-10-CM | POA: Diagnosis not present

## 2020-10-09 DIAGNOSIS — Z23 Encounter for immunization: Secondary | ICD-10-CM | POA: Diagnosis not present

## 2020-10-13 DIAGNOSIS — R030 Elevated blood-pressure reading, without diagnosis of hypertension: Secondary | ICD-10-CM | POA: Diagnosis not present

## 2020-10-13 DIAGNOSIS — G5601 Carpal tunnel syndrome, right upper limb: Secondary | ICD-10-CM | POA: Diagnosis not present

## 2020-10-16 DIAGNOSIS — G5601 Carpal tunnel syndrome, right upper limb: Secondary | ICD-10-CM | POA: Diagnosis not present

## 2020-10-22 ENCOUNTER — Ambulatory Visit: Payer: Medicare Other | Admitting: Neurology

## 2020-11-11 DIAGNOSIS — K219 Gastro-esophageal reflux disease without esophagitis: Secondary | ICD-10-CM | POA: Diagnosis not present

## 2020-11-11 DIAGNOSIS — H42 Glaucoma in diseases classified elsewhere: Secondary | ICD-10-CM | POA: Diagnosis not present

## 2020-11-11 DIAGNOSIS — I10 Essential (primary) hypertension: Secondary | ICD-10-CM | POA: Diagnosis not present

## 2020-11-11 DIAGNOSIS — E119 Type 2 diabetes mellitus without complications: Secondary | ICD-10-CM | POA: Diagnosis not present

## 2020-11-11 DIAGNOSIS — Z7984 Long term (current) use of oral hypoglycemic drugs: Secondary | ICD-10-CM | POA: Diagnosis not present

## 2020-11-11 DIAGNOSIS — Z7982 Long term (current) use of aspirin: Secondary | ICD-10-CM | POA: Diagnosis not present

## 2020-11-11 DIAGNOSIS — Z79899 Other long term (current) drug therapy: Secondary | ICD-10-CM | POA: Diagnosis not present

## 2020-11-11 DIAGNOSIS — H401122 Primary open-angle glaucoma, left eye, moderate stage: Secondary | ICD-10-CM | POA: Diagnosis not present

## 2020-11-11 DIAGNOSIS — E1139 Type 2 diabetes mellitus with other diabetic ophthalmic complication: Secondary | ICD-10-CM | POA: Diagnosis not present

## 2020-11-16 ENCOUNTER — Ambulatory Visit: Payer: Medicare Other | Admitting: Podiatry

## 2020-11-25 ENCOUNTER — Other Ambulatory Visit: Payer: Self-pay

## 2020-11-25 ENCOUNTER — Encounter: Payer: Self-pay | Admitting: Podiatry

## 2020-11-25 ENCOUNTER — Ambulatory Visit: Payer: Medicare Other | Admitting: Podiatry

## 2020-11-25 DIAGNOSIS — N1831 Chronic kidney disease, stage 3a: Secondary | ICD-10-CM

## 2020-11-25 DIAGNOSIS — E0843 Diabetes mellitus due to underlying condition with diabetic autonomic (poly)neuropathy: Secondary | ICD-10-CM | POA: Diagnosis not present

## 2020-11-25 DIAGNOSIS — M79674 Pain in right toe(s): Secondary | ICD-10-CM

## 2020-11-25 DIAGNOSIS — E782 Mixed hyperlipidemia: Secondary | ICD-10-CM | POA: Diagnosis not present

## 2020-11-25 DIAGNOSIS — B351 Tinea unguium: Secondary | ICD-10-CM | POA: Diagnosis not present

## 2020-11-25 DIAGNOSIS — E1121 Type 2 diabetes mellitus with diabetic nephropathy: Secondary | ICD-10-CM | POA: Diagnosis not present

## 2020-11-25 DIAGNOSIS — M79675 Pain in left toe(s): Secondary | ICD-10-CM

## 2020-11-25 NOTE — Progress Notes (Signed)
This patient returns to my office for at risk foot care.  This patient requires this care by a professional since this patient will be at risk due to having dabetes and CKD.   This patient is unable to cut nails himself since the patient cannot reach his nails.These nails are painful walking and wearing shoes.  This patient presents for at risk foot care today.  General Appearance  Alert, conversant and in no acute stress.  Vascular  Dorsalis pedis  pulses are palpable  bilaterally. Posterior tibial pulses are absent  bilaterally. Capillary return is within normal limits  bilaterally. Temperature is within normal limits  bilaterally.  Neurologic  Senn-Weinstein monofilament wire test within normal limits  bilaterally. Muscle power within normal limits bilaterally.  Nails Thick disfigured discolored nails with subungual debris  from hallux to fifth toes bilaterally. No evidence of bacterial infection or drainage bilaterally.  Orthopedic  No limitations of motion  feet .  No crepitus or effusions noted.  No bony pathology or digital deformities noted.  HAV  B/L.  Peroneal spastic flaftoot left.    Skin  normotropic skin with no porokeratosis noted bilaterally.  No signs of infections or ulcers noted.     Onychomycosis  Pain in right toes  Pain in left toes  Consent was obtained for treatment procedures.   Mechanical debridement of nails 1-5  bilaterally performed with a nail nipper.  Filed with dremel without incident.    Return office visit   4 months                   Told patient to return for periodic foot care and evaluation due to potential at risk complications.   Gardiner Barefoot DPM

## 2020-12-08 DIAGNOSIS — M7989 Other specified soft tissue disorders: Secondary | ICD-10-CM | POA: Diagnosis not present

## 2020-12-08 DIAGNOSIS — I10 Essential (primary) hypertension: Secondary | ICD-10-CM | POA: Diagnosis not present

## 2020-12-08 DIAGNOSIS — E782 Mixed hyperlipidemia: Secondary | ICD-10-CM | POA: Diagnosis not present

## 2020-12-08 DIAGNOSIS — L299 Pruritus, unspecified: Secondary | ICD-10-CM | POA: Diagnosis not present

## 2020-12-08 DIAGNOSIS — E1121 Type 2 diabetes mellitus with diabetic nephropathy: Secondary | ICD-10-CM | POA: Diagnosis not present

## 2020-12-11 DIAGNOSIS — I10 Essential (primary) hypertension: Secondary | ICD-10-CM | POA: Diagnosis not present

## 2020-12-11 DIAGNOSIS — R202 Paresthesia of skin: Secondary | ICD-10-CM | POA: Diagnosis not present

## 2020-12-14 DIAGNOSIS — M25512 Pain in left shoulder: Secondary | ICD-10-CM | POA: Diagnosis not present

## 2020-12-14 DIAGNOSIS — M549 Dorsalgia, unspecified: Secondary | ICD-10-CM | POA: Diagnosis not present

## 2020-12-14 DIAGNOSIS — R202 Paresthesia of skin: Secondary | ICD-10-CM | POA: Diagnosis not present

## 2020-12-14 DIAGNOSIS — M7989 Other specified soft tissue disorders: Secondary | ICD-10-CM | POA: Diagnosis not present

## 2020-12-14 DIAGNOSIS — G5600 Carpal tunnel syndrome, unspecified upper limb: Secondary | ICD-10-CM | POA: Diagnosis not present

## 2020-12-14 DIAGNOSIS — M79641 Pain in right hand: Secondary | ICD-10-CM | POA: Diagnosis not present

## 2020-12-14 DIAGNOSIS — M150 Primary generalized (osteo)arthritis: Secondary | ICD-10-CM | POA: Diagnosis not present

## 2020-12-16 DIAGNOSIS — I10 Essential (primary) hypertension: Secondary | ICD-10-CM | POA: Diagnosis not present

## 2020-12-16 DIAGNOSIS — K219 Gastro-esophageal reflux disease without esophagitis: Secondary | ICD-10-CM | POA: Diagnosis not present

## 2020-12-16 DIAGNOSIS — E118 Type 2 diabetes mellitus with unspecified complications: Secondary | ICD-10-CM | POA: Diagnosis not present

## 2021-01-11 DIAGNOSIS — M25512 Pain in left shoulder: Secondary | ICD-10-CM | POA: Diagnosis not present

## 2021-01-11 DIAGNOSIS — G5600 Carpal tunnel syndrome, unspecified upper limb: Secondary | ICD-10-CM | POA: Diagnosis not present

## 2021-01-11 DIAGNOSIS — M79641 Pain in right hand: Secondary | ICD-10-CM | POA: Diagnosis not present

## 2021-01-11 DIAGNOSIS — R202 Paresthesia of skin: Secondary | ICD-10-CM | POA: Diagnosis not present

## 2021-01-11 DIAGNOSIS — M7989 Other specified soft tissue disorders: Secondary | ICD-10-CM | POA: Diagnosis not present

## 2021-01-11 DIAGNOSIS — M1990 Unspecified osteoarthritis, unspecified site: Secondary | ICD-10-CM | POA: Diagnosis not present

## 2021-01-11 DIAGNOSIS — M549 Dorsalgia, unspecified: Secondary | ICD-10-CM | POA: Diagnosis not present

## 2021-01-11 DIAGNOSIS — M150 Primary generalized (osteo)arthritis: Secondary | ICD-10-CM | POA: Diagnosis not present

## 2021-01-12 ENCOUNTER — Other Ambulatory Visit: Payer: Self-pay | Admitting: Rheumatology

## 2021-01-12 DIAGNOSIS — M79641 Pain in right hand: Secondary | ICD-10-CM

## 2021-02-18 DIAGNOSIS — E782 Mixed hyperlipidemia: Secondary | ICD-10-CM | POA: Diagnosis not present

## 2021-02-18 DIAGNOSIS — E118 Type 2 diabetes mellitus with unspecified complications: Secondary | ICD-10-CM | POA: Diagnosis not present

## 2021-02-18 DIAGNOSIS — I10 Essential (primary) hypertension: Secondary | ICD-10-CM | POA: Diagnosis not present

## 2021-02-23 DIAGNOSIS — E1122 Type 2 diabetes mellitus with diabetic chronic kidney disease: Secondary | ICD-10-CM | POA: Diagnosis not present

## 2021-02-23 DIAGNOSIS — I701 Atherosclerosis of renal artery: Secondary | ICD-10-CM | POA: Diagnosis not present

## 2021-02-23 DIAGNOSIS — N1831 Chronic kidney disease, stage 3a: Secondary | ICD-10-CM | POA: Diagnosis not present

## 2021-02-23 DIAGNOSIS — G8929 Other chronic pain: Secondary | ICD-10-CM | POA: Diagnosis not present

## 2021-02-23 DIAGNOSIS — I129 Hypertensive chronic kidney disease with stage 1 through stage 4 chronic kidney disease, or unspecified chronic kidney disease: Secondary | ICD-10-CM | POA: Diagnosis not present

## 2021-02-24 DIAGNOSIS — I10 Essential (primary) hypertension: Secondary | ICD-10-CM | POA: Diagnosis not present

## 2021-02-24 DIAGNOSIS — E1121 Type 2 diabetes mellitus with diabetic nephropathy: Secondary | ICD-10-CM | POA: Diagnosis not present

## 2021-02-24 DIAGNOSIS — N1831 Chronic kidney disease, stage 3a: Secondary | ICD-10-CM | POA: Diagnosis not present

## 2021-02-24 DIAGNOSIS — E782 Mixed hyperlipidemia: Secondary | ICD-10-CM | POA: Diagnosis not present

## 2021-02-24 DIAGNOSIS — L299 Pruritus, unspecified: Secondary | ICD-10-CM | POA: Diagnosis not present

## 2021-03-04 DIAGNOSIS — E1121 Type 2 diabetes mellitus with diabetic nephropathy: Secondary | ICD-10-CM | POA: Diagnosis not present

## 2021-03-04 DIAGNOSIS — K219 Gastro-esophageal reflux disease without esophagitis: Secondary | ICD-10-CM | POA: Diagnosis not present

## 2021-03-04 DIAGNOSIS — E782 Mixed hyperlipidemia: Secondary | ICD-10-CM | POA: Diagnosis not present

## 2021-03-04 DIAGNOSIS — I10 Essential (primary) hypertension: Secondary | ICD-10-CM | POA: Diagnosis not present

## 2021-03-22 DIAGNOSIS — M79641 Pain in right hand: Secondary | ICD-10-CM | POA: Diagnosis not present

## 2021-03-22 DIAGNOSIS — M7989 Other specified soft tissue disorders: Secondary | ICD-10-CM | POA: Diagnosis not present

## 2021-03-22 DIAGNOSIS — M1990 Unspecified osteoarthritis, unspecified site: Secondary | ICD-10-CM | POA: Diagnosis not present

## 2021-03-22 DIAGNOSIS — M150 Primary generalized (osteo)arthritis: Secondary | ICD-10-CM | POA: Diagnosis not present

## 2021-03-22 DIAGNOSIS — M549 Dorsalgia, unspecified: Secondary | ICD-10-CM | POA: Diagnosis not present

## 2021-03-22 DIAGNOSIS — G5600 Carpal tunnel syndrome, unspecified upper limb: Secondary | ICD-10-CM | POA: Diagnosis not present

## 2021-03-22 DIAGNOSIS — R202 Paresthesia of skin: Secondary | ICD-10-CM | POA: Diagnosis not present

## 2021-03-26 ENCOUNTER — Other Ambulatory Visit: Payer: Self-pay

## 2021-03-26 ENCOUNTER — Ambulatory Visit: Payer: Medicare Other | Admitting: Podiatry

## 2021-03-26 ENCOUNTER — Encounter: Payer: Self-pay | Admitting: Podiatry

## 2021-03-26 DIAGNOSIS — B351 Tinea unguium: Secondary | ICD-10-CM | POA: Diagnosis not present

## 2021-03-26 DIAGNOSIS — E0843 Diabetes mellitus due to underlying condition with diabetic autonomic (poly)neuropathy: Secondary | ICD-10-CM

## 2021-03-26 DIAGNOSIS — N1831 Chronic kidney disease, stage 3a: Secondary | ICD-10-CM | POA: Diagnosis not present

## 2021-03-26 DIAGNOSIS — M79675 Pain in left toe(s): Secondary | ICD-10-CM

## 2021-03-26 DIAGNOSIS — M79674 Pain in right toe(s): Secondary | ICD-10-CM | POA: Diagnosis not present

## 2021-03-26 DIAGNOSIS — M2141 Flat foot [pes planus] (acquired), right foot: Secondary | ICD-10-CM

## 2021-03-26 NOTE — Progress Notes (Signed)
This patient returns to my office for at risk foot care.  This patient requires this care by a professional since this patient will be at risk due to having dabetes and CKD.  Patient requests new diabetic shoes.  This patient is unable to cut nails herself since the patient cannot reach her nails.These nails are painful walking and wearing shoes.  This patient presents for at risk foot care today.  General Appearance  Alert, conversant and in no acute stress.  Vascular  Dorsalis pedis  pulses are palpable  bilaterally. Posterior tibial pulses are absent  bilaterally. Capillary return is within normal limits  bilaterally. Temperature is within normal limits  bilaterally.  Neurologic  Senn-Weinstein monofilament wire test within normal limits  bilaterally. Muscle power within normal limits bilaterally.  Nails Thick disfigured discolored nails with subungual debris  from hallux to fifth toes bilaterally. No evidence of bacterial infection or drainage bilaterally.  Orthopedic  No limitations of motion  feet .  No crepitus or effusions noted.  No bony pathology or digital deformities noted.  HAV  B/L.  Peroneal spastic flaftoot left.    Skin  normotropic skin with no porokeratosis noted bilaterally.  No signs of infections or ulcers noted.     Onychomycosis  Pain in right toes  Pain in left toes  Consent was obtained for treatment procedures.   Mechanical debridement of nails 1-5  bilaterally performed with a nail nipper.  Filed with dremel without incident. Patient requests diabetic shoes. Told her to make an appointment with pedorthist.   Return office visit   3  months                   Told patient to return for periodic foot care and evaluation due to potential at risk complications.   Gardiner Barefoot DPM

## 2021-03-29 ENCOUNTER — Ambulatory Visit: Payer: Medicare Other | Admitting: Podiatry

## 2021-03-30 DIAGNOSIS — R200 Anesthesia of skin: Secondary | ICD-10-CM | POA: Diagnosis not present

## 2021-03-30 DIAGNOSIS — Z9889 Other specified postprocedural states: Secondary | ICD-10-CM | POA: Diagnosis not present

## 2021-04-05 DIAGNOSIS — H401132 Primary open-angle glaucoma, bilateral, moderate stage: Secondary | ICD-10-CM | POA: Diagnosis not present

## 2021-04-05 DIAGNOSIS — E119 Type 2 diabetes mellitus without complications: Secondary | ICD-10-CM | POA: Diagnosis not present

## 2021-04-08 DIAGNOSIS — E213 Hyperparathyroidism, unspecified: Secondary | ICD-10-CM | POA: Diagnosis not present

## 2021-04-08 DIAGNOSIS — E559 Vitamin D deficiency, unspecified: Secondary | ICD-10-CM | POA: Diagnosis not present

## 2021-04-08 DIAGNOSIS — E118 Type 2 diabetes mellitus with unspecified complications: Secondary | ICD-10-CM | POA: Diagnosis not present

## 2021-04-13 ENCOUNTER — Other Ambulatory Visit: Payer: Self-pay | Admitting: Internal Medicine

## 2021-04-13 DIAGNOSIS — Z1231 Encounter for screening mammogram for malignant neoplasm of breast: Secondary | ICD-10-CM

## 2021-04-14 DIAGNOSIS — I10 Essential (primary) hypertension: Secondary | ICD-10-CM | POA: Diagnosis not present

## 2021-04-14 DIAGNOSIS — E1121 Type 2 diabetes mellitus with diabetic nephropathy: Secondary | ICD-10-CM | POA: Diagnosis not present

## 2021-04-27 ENCOUNTER — Ambulatory Visit
Admission: RE | Admit: 2021-04-27 | Discharge: 2021-04-27 | Disposition: A | Discharge status: Home / Self Care | Payer: Medicare Other | Source: Ambulatory Visit | Attending: Internal Medicine | Admitting: Internal Medicine

## 2021-04-27 DIAGNOSIS — Z1231 Encounter for screening mammogram for malignant neoplasm of breast: Secondary | ICD-10-CM | POA: Diagnosis not present

## 2021-05-05 ENCOUNTER — Ambulatory Visit: Payer: Medicare Other

## 2021-05-05 DIAGNOSIS — M2141 Flat foot [pes planus] (acquired), right foot: Secondary | ICD-10-CM

## 2021-05-05 DIAGNOSIS — E0843 Diabetes mellitus due to underlying condition with diabetic autonomic (poly)neuropathy: Secondary | ICD-10-CM

## 2021-05-05 NOTE — Progress Notes (Signed)
SITUATION ?Reason for Consult: Evaluation for Prefabricated Diabetic Shoes and Custom Diabetic Inserts. ?Patient / Caregiver Report: Patient would like well fitting shoes ? ?OBJECTIVE DATA: ?Patient History / Diagnosis:  ?  ICD-10-CM   ?1. Diabetes mellitus due to underlying condition with diabetic autonomic neuropathy, unspecified whether long term insulin use (HCC)  E08.43   ?  ?2. Pes planus of both feet  M21.41   ? M21.42   ?  ? ? ?Physician Treating Diabetes:  Deland Pretty, MD ? ?Current or Previous Devices:   Current user ? ?In-Person Foot Examination: ?Ulcers & Callousing:   None ?Deformities:    Pes planus ?Sensation:    Compromised  ?Shoe Size:     8.5W ? ?ORTHOTIC RECOMMENDATION ?Recommended Devices: ?- 1x pair prefabricated PDAC approved diabetic shoes; Patient Selected Apex X801M Size 8.5W ?- 3x pair custom-to-patient PDAC approved vacuum formed diabetic insoles. ? ?GOALS OF SHOES AND INSOLES ?- Reduce shear and pressure ?- Reduce / Prevent callus formation ?- Reduce / Prevent ulceration ?- Protect the fragile healing compromised diabetic foot. ? ?Patient would benefit from diabetic shoes and inserts as patient has diabetes mellitus and the patient has one or more of the following conditions: ?- History of partial or complete amputation of the foot ?- History of previous foot ulceration. ?- History of pre-ulcerative callus ?- Peripheral neuropathy with evidence of callus formation ?- Foot deformity ?- Poor circulation ? ?ACTIONS PERFORMED ?Potential out of pocket cost was communicated to patient. Patient understood and consented to measurement and casting. Patient was casted for insoles via crush box and measured for shoes via brannock device. Procedure was explained and patient tolerated procedure well. All questions were answered and concerns addressed. Casts were shipped to central fabrication for HOLD until Certificate of Medical Necessity or otherwise necessary authorization from insurance is  obtained. ? ?PLAN ?Shoes are to be ordered and casts released from hold once all appropriate paperwork is complete. Patient is to be contacted and scheduled for fitting once shoes and insoles have been fabricated and received. ? ?

## 2021-06-01 DIAGNOSIS — G5600 Carpal tunnel syndrome, unspecified upper limb: Secondary | ICD-10-CM | POA: Diagnosis not present

## 2021-06-01 DIAGNOSIS — M79641 Pain in right hand: Secondary | ICD-10-CM | POA: Diagnosis not present

## 2021-06-01 DIAGNOSIS — R202 Paresthesia of skin: Secondary | ICD-10-CM | POA: Diagnosis not present

## 2021-06-01 DIAGNOSIS — N1831 Chronic kidney disease, stage 3a: Secondary | ICD-10-CM | POA: Diagnosis not present

## 2021-06-01 DIAGNOSIS — M150 Primary generalized (osteo)arthritis: Secondary | ICD-10-CM | POA: Diagnosis not present

## 2021-06-01 DIAGNOSIS — M1990 Unspecified osteoarthritis, unspecified site: Secondary | ICD-10-CM | POA: Diagnosis not present

## 2021-06-01 DIAGNOSIS — M7989 Other specified soft tissue disorders: Secondary | ICD-10-CM | POA: Diagnosis not present

## 2021-06-01 DIAGNOSIS — M549 Dorsalgia, unspecified: Secondary | ICD-10-CM | POA: Diagnosis not present

## 2021-06-11 ENCOUNTER — Telehealth: Payer: Self-pay

## 2021-06-11 NOTE — Telephone Encounter (Signed)
CMN submitted ?

## 2021-06-22 NOTE — Discharge Instructions (Signed)
POST-OPERATIVE INSTRUCTIONS FOR CATARACT SURGERY     1. No heavy lifting (no more than 5 pounds). No Bending at waist and No strenuous activity for one (1) week.     2. DO NOT RUB YOUR EYE!!! Wear eye protection at all times when going outdoors (glasses, sunglasses,        ect) and wear shield while sleeping/napping for the first week after surgery.      3. DO NOT GET WATER IN YOUR EYES FOR THE NEXT 3 DAYS. You may gently clean your face and you        may shower, but don't put any pressure on the eye. Ladies, no eye makeup for one (1) week after surgery.        Do not swim or get into a hot tub for three (3) weeks.     4. You may experience eye irritation, aching, itching and blurred vision for several days. Use Tylenol for         Discomfort but DO NOT RUB YOUR EYE .     5. Call your doctor with any increased pain, redness, nausea, vomiting, or worsening vision. Also call your doctor        with new flashes of light, many new floaters or a curtain/veil coming into your vision.                                               WASH YOUR HANDS BEFORE PUTTING IN YOUR DROPS                                       ALLOW 5 MINUTES BETWEEN DIFFERENT DROPS                       IF YOU HAVE ANY QUESTION OR CONCERNS DURING OFFICE HOUR                      CALL [phone] OR [phone] AFTER HOURS OR ON WEEKENDS

## 2021-06-23 ENCOUNTER — Inpatient Hospital Stay: Payer: Medicare (Managed Care) | Attending: Ophthalmology

## 2021-06-23 MED ORDER — SODIUM CHLORIDE 0.9 % IV SOLN
0.9 % | INTRAVENOUS | Status: DC | PRN
Start: 2021-06-23 — End: 2021-06-23

## 2021-06-23 MED ORDER — TETRACAINE HCL 0.5 % OP SOLN
0.5 % | OPHTHALMIC | Status: DC | PRN
Start: 2021-06-23 — End: 2021-06-23
  Administered 2021-06-23: 17:00:00 1 via OPHTHALMIC

## 2021-06-23 MED ORDER — MIDAZOLAM HCL 2 MG/2ML IJ SOLN
2 MG/ML | INTRAMUSCULAR | Status: AC
Start: 2021-06-23 — End: ?

## 2021-06-23 MED ORDER — NORMAL SALINE FLUSH 0.9 % IV SOLN
0.9 % | Freq: Two times a day (BID) | INTRAVENOUS | Status: DC
Start: 2021-06-23 — End: 2021-06-23

## 2021-06-23 MED ORDER — NORMAL SALINE FLUSH 0.9 % IV SOLN
0.9 % | INTRAVENOUS | Status: DC | PRN
Start: 2021-06-23 — End: 2021-06-23

## 2021-06-23 MED ORDER — MIDAZOLAM HCL 2 MG/2ML IJ SOLN
2 MG/ML | INTRAMUSCULAR | Status: DC | PRN
Start: 2021-06-23 — End: 2021-06-23
  Administered 2021-06-23: 17:00:00 2 via INTRAVENOUS

## 2021-06-23 MED ORDER — ALBUTEROL SULFATE (2.5 MG/3ML) 0.083% IN NEBU
RESPIRATORY_TRACT | Status: DC | PRN
Start: 2021-06-23 — End: 2021-06-23

## 2021-06-23 MED ORDER — MOXIFLOXACIN HCL 0.5 % OP SOLN
0.5 % | OPHTHALMIC | Status: DC | PRN
Start: 2021-06-23 — End: 2021-06-23
  Administered 2021-06-23: 17:00:00 1 via OPHTHALMIC

## 2021-06-23 MED ORDER — LACTATED RINGERS IV SOLN
INTRAVENOUS | Status: DC
Start: 2021-06-23 — End: 2021-06-23

## 2021-06-23 MED ORDER — EPINEPHRINE 1 MG/ML IJ SOLN (MIXTURES ONLY)
Status: DC | PRN
Start: 2021-06-23 — End: 2021-06-23
  Administered 2021-06-23: 17:00:00 0.1

## 2021-06-23 MED ORDER — BSS PLUS IO SOLN
INTRAOCULAR | Status: DC | PRN
Start: 2021-06-23 — End: 2021-06-23
  Administered 2021-06-23: 17:00:00 250

## 2021-06-23 MED ORDER — LIDOCAINE HCL (PF) 1 % IJ SOLN
1 % | INTRAMUSCULAR | Status: DC | PRN
Start: 2021-06-23 — End: 2021-06-23
  Administered 2021-06-23: 17:00:00 1 via INTRAOCULAR

## 2021-06-23 MED ORDER — PLEDGET CATARACT MIXTURE
Freq: Once | Status: AC
Start: 2021-06-23 — End: 2021-06-23
  Administered 2021-06-23: 15:00:00 via OPHTHALMIC

## 2021-06-23 MED FILL — SODIUM CHLORIDE FLUSH 0.9 % IV SOLN: 0.9 % | INTRAVENOUS | Qty: 40

## 2021-06-23 MED FILL — LACTATED RINGERS IV SOLN: INTRAVENOUS | Qty: 1000

## 2021-06-23 MED FILL — MIDAZOLAM HCL 2 MG/2ML IJ SOLN: 2 MG/ML | INTRAMUSCULAR | Qty: 2

## 2021-06-23 NOTE — Anesthesia Post-Procedure Evaluation (Signed)
Department of Anesthesiology  Postprocedure Note    Patient: Deborah Ingram  MRN: [id number]  Birthdate: 1946-01-09  Date of evaluation: 06/23/2021      Procedure Summary     Date: 06/23/21 Room / Location: [facility] 02 / [facility]    Anesthesia Start: 1234 Anesthesia Stop: 1259    Procedure: PHACO IOL OD (Right: Eye) Diagnosis:       Nuclear sclerotic cataract of right eye      (Nuclear sclerotic cataract of right eye [H25.11])    Surgeons: Renato Battles, MD Responsible Provider: Norville Haggard, APRN - CRNA    Anesthesia Type: MAC ASA Status: 2          Anesthesia Type: MAC    Aldrete Phase I: Aldrete Score: 10    Aldrete Phase II:        Anesthesia Post Evaluation    Patient location during evaluation: PACU  Patient participation: complete - patient participated  Level of consciousness: awake  Airway patency: patent  Nausea & Vomiting: no vomiting and no nausea  Complications: no  Cardiovascular status: blood pressure returned to baseline and hemodynamically stable  Respiratory status: room air  Hydration status: stable  Multimodal analgesia pain management approach

## 2021-06-23 NOTE — Anesthesia Pre-Procedure Evaluation (Signed)
Department of Anesthesiology  Preprocedure Note       Name:  Deborah Ingram   Age:  76 y.o.  DOB:  1945/06/07                                          MRN:  [id number]         Date:  06/23/2021      Surgeon: Juliann Mule):  Renato Battles, MD    Procedure: Procedure(s):  PHACO IOL OD    Medications prior to admission:   Prior to Admission medications    Medication Sig Start Date End Date Taking? Authorizing Provider   famotidine (PEPCID) 20 MG tablet Take 1 tablet by mouth 2 times daily 12/17/19   Ar Automatic Reconciliation   levothyroxine (SYNTHROID) 25 MCG tablet TAKE 2 TABLETS ONCE DAILY 11/26/19   Ar Automatic Reconciliation   losartan-hydroCHLOROthiazide (HYZAAR) 50-12.5 MG per tablet Take 1 tablet by mouth daily 02/07/20   Ar Automatic Reconciliation       Current medications:    Current Facility-Administered Medications   Medication Dose Route Frequency Provider Last Rate Last Admin   . lactated ringers IV soln infusion   IntraVENous Continuous Pamala Hurry, APRN - CRNA       . sodium chloride flush 0.9 % injection 5-40 mL  5-40 mL IntraVENous 2 times per day Pamala Hurry, APRN - CRNA       . sodium chloride flush 0.9 % injection 5-40 mL  5-40 mL IntraVENous PRN Pamala Hurry, APRN - CRNA       . 0.9 % sodium chloride infusion   IntraVENous PRN Pamala Hurry, APRN - CRNA       . albuterol (PROVENTIL) (2.5 MG/3ML) 0.083% nebulizer solution 2.5 mg  2.5 mg Nebulization Q2H PRN Pamala Hurry, APRN - CRNA       . sodium chloride flush 0.9 % injection 5-40 mL  5-40 mL IntraVENous 2 times per day Renato Battles, MD       . sodium chloride flush 0.9 % injection 5-40 mL  5-40 mL IntraVENous PRN Renato Battles, MD       . 0.9 % sodium chloride infusion   IntraVENous PRN Renato Battles, MD           Allergies:    Allergies   Allergen Reactions   . Aspirin      Other reaction(s): Unknown (comments)  GI symptoms - ok to take   . Codeine      Other reaction(s): Unknown (comments)        Problem List:  There is no problem list on file for this patient.      Past Medical History:        Diagnosis Date   . Heart disease    . Hypertension    . Thyroid disease        Past Surgical History:  History reviewed. No pertinent surgical history.    Social History:    Social History     Tobacco Use   . Smoking status: Never   . Smokeless tobacco: Never   Substance Use Topics   . Alcohol use: Never                                Counseling given:  Not Answered      Vital Signs (Current):   Vitals:    05/28/21 1430 06/23/21 1043   BP:  (!) 151/80   Pulse:  74   Resp:  14   Temp:  36.7 C (98 F)   TempSrc:  Temporal   SpO2:  99%   Weight: 143 lb (64.9 kg)    Height: _0  (1.626 m)                                               BP Readings from Last 3 Encounters:   06/23/21 (!) 151/80       NPO Status: Time of last liquid consumption: 2000                        Time of last solid consumption: 2000                        Date of last liquid consumption: 06/22/21                        Date of last solid food consumption: 06/22/21    BMI:   Wt Readings from Last 3 Encounters:   05/28/21 143 lb (64.9 kg)   02/18/20 135 lb (61.2 kg)     Body mass index is 24.55 kg/m.    CBC: No results found for: WBC, RBC, HGB, HCT, MCV, RDW, PLT    CMP: No results found for: NA, K, CL, CO2, BUN, CREATININE, GFRAA, AGRATIO, LABGLOM, GLUCOSE, GLU, PROT, CALCIUM, BILITOT, ALKPHOS, AST, ALT    POC Tests: No results for input(s): POCGLU, POCNA, POCK, POCCL, POCBUN, POCHEMO, POCHCT in the last 72 hours.    Coags: No results found for: PROTIME, INR, APTT    HCG (If Applicable): No results found for: PREGTESTUR, PREGSERUM, HCG, HCGQUANT     ABGs: No results found for: PHART, PO2ART, PCO2ART, HCO3ART, BEART, O2SATART     Type & Screen (If Applicable):  No results found for: LABABO, LABRH    Drug/Infectious Status (If Applicable):  No results found for: HIV, HEPCAB    COVID-19 Screening (If Applicable): No results found for:  COVID19        Anesthesia Evaluation  Patient summary reviewed and Nursing notes reviewed  Airway: Mallampati: II  TM distance: >3 FB   Neck ROM: full  Mouth opening: > = 3 FB   Dental: normal exam         Pulmonary:Negative Pulmonary ROS and normal exam  breath sounds clear to auscultation                             Cardiovascular:Negative CV ROS  Exercise tolerance: good (>4 METS),   (+) hypertension:,         Rhythm: regular  Rate: normal                    Neuro/Psych:   Negative Neuro/Psych ROS              GI/Hepatic/Renal: Neg GI/Hepatic/Renal ROS  (+) GERD:,           Endo/Other: Negative Endo/Other ROS   (+) hypothyroidism::., .  Abdominal:             Vascular: negative vascular ROS.         Other Findings:           Anesthesia Plan      MAC     ASA 2       Induction: intravenous.      Anesthetic plan and risks discussed with patient.                        Norville Haggard, APRN - CRNA   06/23/2021

## 2021-06-23 NOTE — H&P (Signed)
Day of Surgery H&P:  Deborah Ingram was seen and examined.  There have been no significant clinical changes since the completion of the exam in the office.  Pt continues to complain of persistent poor vision and/or glare in planned operative eye affecting ability to perform basic ADLs (such as reading or driving at night).        Past Medical History:   Diagnosis Date    Heart disease     Hypertension     Thyroid disease        Family Medical History: Non-contributory    HOME MED LIST:  Prior to Admission medications    Medication Sig Start Date End Date Taking? Authorizing Provider   famotidine (PEPCID) 20 MG tablet Take 1 tablet by mouth 2 times daily 12/17/19   Ar Automatic Reconciliation   levothyroxine (SYNTHROID) 25 MCG tablet TAKE 2 TABLETS ONCE DAILY 11/26/19   Ar Automatic Reconciliation   losartan-hydroCHLOROthiazide (HYZAAR) 50-12.5 MG per tablet Take 1 tablet by mouth daily 02/07/20   Ar Automatic Reconciliation       Allergies   Allergen Reactions    Aspirin      Other reaction(s): Unknown (comments)  GI symptoms - ok to take    Codeine      Other reaction(s): Unknown (comments)       Review of Systems  Constitutional:  Negative for chills and fever.  HENT:  Negative for congestion and sore throat.    Eyes:  Positive for blurred vision.  Respiratory:  Negative for cough and shortness of breath.    Cardiovascular:  Negative for chest pain and palpitations.  Gastrointestinal:  Negative for nausea and vomiting.  Genitourinary:  Negative for dysuria.  Musculoskeletal:  Negative for myalgias.  Skin:  Negative for rash.  Neurological:  Negative for dizziness and headaches.      Vitals:    06/23/21 1043   BP: (!) 151/80   Pulse: 74   Resp: 14   Temp: 98 F (36.7 C)   SpO2: 99%       Alert & Oriented x 3  CV: Regular Rate, Regular rhythm, no murmurs/rubs/gallops  Pulm: Clear to auscultation bilaterally, no wheezes/rales/rhonchi  Neuro: CNII-XII grossly intact      Proceed with Surgery as planned.      Signed ZM:[id number] Huntley Estelle, MD   06/23/21, 12:20 PM

## 2021-06-23 NOTE — Op Note (Signed)
OPERATIVE REPORT    PATIENT INFORMATION:    Patient: Deborah Ingram: [id number] SSN: [id number]  Date of Birth: 03/07/1945 Age: 76 y.o. Sex: female      LOCATION OF SURGERY: [facility] [address] [place] [address] [place]:  06/23/21      PRE-OPERATIVE DIAGNOSIS: Cataract Right eye.  POST OPERATIVE DIAGNOSIS: Cataract Right eye.      PROCEDURE PERFORMED: Phacoemulsification with intraocular lens Right eye.  SURGEON: Dannielle Burn, M.D.  ANESTHESIA: Monitored anesthesia.  COMPLICATIONS: None.  BLOOD LOSS: None      INDICATIONS FOR PROCEDURE:  This is a 76 y.o. year old female with progressively decreasing vision in the right eye. Risks, benefits and alternatives of surgery were explained at length with the patient and informed consent was obtained.       PROCEDURE:  The patient was met in the pre-operative holding area where confirmation was made that the right eye was to be operated on and surgical eye was marked. The patient was taken to the operating room where anesthesia staff was present to give temporary sedation. Following the instillation of topical tetracaine drops to the operative eye the patient was then prepped and draped in the usual sterile fashion for ophthalmic surgery. The lid speculum was placed and the lids were retracted.  A paracentesis was made through the clear cornea, shugarcaine was injected in the eye for improved dilation. The anterior chamber was deepened with viscoelastic material. A 2.4 mm keratome was used to make a self-sealing clear corneal incision. Capsulorrhexis was initiated with a cystotome and completed with Utrata forceps without difficulty. Hydrodissection was completed and good fluid wave was visualized. Phacoemulsification was initiated and completed in a divide and conquer fashion without difficulty. Irrigation and aspiration was used to remove the residual cortical material from the capsular bag and then the  capsular bag and the anterior chamber were deepened with viscoelastic material. The bag was inspected and found to be free of any tears or defects. The intraocular lens implant listed below was placed into the capsular bag with good centration. Irrigation and aspiration was used to remove the residual viscoelastic material from the capsular bag and the anterior chamber and the incision sites were hydrated the BSS and cannula.  The incisions were rehydrated and inspected with a weck-cel sponge and were found to be water tight. The eye was inspected and the anterior chamber was deep, the pupil round, and the lens was in good position. The lid speculum was then removed under visualization. Several drops of antibiotic eye drops were instilled into patient's operated eye. The operated eye was then covered with an eyeshield. The patient tolerated the procedure well and was transferred to recovery in good condition.    Implant Name Type Inv. Item Serial No. Manufacturer Lot No. LRB No. Used Action   LENS IOL TECNIS 1-PIECE ACRYLIC [id number] - [id number]  LENS IOL TECNIS 1-PIECE ACRYLIC [id number] [place] [employer]  Right 1 Implanted       Renato Battles, MD  06/23/21 1:53 PM

## 2021-06-25 ENCOUNTER — Telehealth: Payer: Self-pay

## 2021-06-25 ENCOUNTER — Other Ambulatory Visit: Payer: Medicare Other

## 2021-06-25 ENCOUNTER — Encounter: Payer: Self-pay | Admitting: Podiatry

## 2021-06-25 ENCOUNTER — Ambulatory Visit: Payer: Medicare Other | Admitting: Podiatry

## 2021-06-25 DIAGNOSIS — B351 Tinea unguium: Secondary | ICD-10-CM

## 2021-06-25 DIAGNOSIS — M2142 Flat foot [pes planus] (acquired), left foot: Secondary | ICD-10-CM

## 2021-06-25 DIAGNOSIS — N1831 Chronic kidney disease, stage 3a: Secondary | ICD-10-CM | POA: Diagnosis not present

## 2021-06-25 DIAGNOSIS — E0843 Diabetes mellitus due to underlying condition with diabetic autonomic (poly)neuropathy: Secondary | ICD-10-CM

## 2021-06-25 DIAGNOSIS — M79674 Pain in right toe(s): Secondary | ICD-10-CM | POA: Diagnosis not present

## 2021-06-25 DIAGNOSIS — M79675 Pain in left toe(s): Secondary | ICD-10-CM

## 2021-06-25 DIAGNOSIS — M2141 Flat foot [pes planus] (acquired), right foot: Secondary | ICD-10-CM | POA: Diagnosis not present

## 2021-06-25 NOTE — Progress Notes (Signed)
This patient returns to my office for at risk foot care.  This patient requires this care by a professional since this patient will be at risk due to having dabetes and CKD.  Patient requests new diabetic shoes.  This patient is unable to cut nails herself since the patient cannot reach her nails.These nails are painful walking and wearing shoes.  This patient presents for at risk foot care today.  General Appearance  Alert, conversant and in no acute stress.  Vascular  Dorsalis pedis  pulses are palpable  bilaterally. Posterior tibial pulses are absent  bilaterally. Capillary return is within normal limits  bilaterally. Temperature is within normal limits  bilaterally.  Neurologic  Senn-Weinstein monofilament wire test within normal limits  bilaterally. Muscle power within normal limits bilaterally.  Nails Thick disfigured discolored nails with subungual debris  from hallux to fifth toes bilaterally. No evidence of bacterial infection or drainage bilaterally.  Orthopedic  No limitations of motion  feet .  No crepitus or effusions noted.  No bony pathology or digital deformities noted.  HAV  B/L.  Peroneal spastic flaftoot left.    Skin  normotropic skin with no porokeratosis noted bilaterally.  No signs of infections or ulcers noted.     Onychomycosis  Pain in right toes  Pain in left toes  Consent was obtained for treatment procedures.   Mechanical debridement of nails 1-5  bilaterally performed with a nail nipper.  Filed with dremel without incident. Patient requests diabetic shoes. T   Return office visit   3  months                   Told patient to return for periodic foot care and evaluation due to potential at risk complications.   Gardiner Barefoot DPM

## 2021-06-25 NOTE — Telephone Encounter (Signed)
Patient was seen in office. Printed copy of CMN was provided for her to take to Dr. Shelia Media

## 2021-07-14 ENCOUNTER — Inpatient Hospital Stay: Payer: Medicare (Managed Care) | Attending: Ophthalmology

## 2021-07-14 MED ORDER — NORMAL SALINE FLUSH 0.9 % IV SOLN
0.9 % | Freq: Two times a day (BID) | INTRAVENOUS | Status: DC
Start: 2021-07-14 — End: 2021-07-14

## 2021-07-14 MED ORDER — MIDAZOLAM HCL 2 MG/2ML IJ SOLN
2 MG/ML | INTRAMUSCULAR | Status: AC
Start: 2021-07-14 — End: ?

## 2021-07-14 MED ORDER — LIDOCAINE HCL (PF) 1 % IJ SOLN
1 % | INTRAMUSCULAR | Status: DC | PRN
Start: 2021-07-14 — End: 2021-07-14
  Administered 2021-07-14: 15:00:00 1 via INTRAOCULAR

## 2021-07-14 MED ORDER — EPINEPHRINE 1 MG/ML IJ SOLN (MIXTURES ONLY)
Status: DC | PRN
Start: 2021-07-14 — End: 2021-07-14
  Administered 2021-07-14: 15:00:00 0.1

## 2021-07-14 MED ORDER — BSS PLUS IO SOLN
INTRAOCULAR | Status: DC | PRN
Start: 2021-07-14 — End: 2021-07-14
  Administered 2021-07-14: 15:00:00 250

## 2021-07-14 MED ORDER — PLEDGET CATARACT MIXTURE
Freq: Once | Status: AC
Start: 2021-07-14 — End: 2021-07-14
  Administered 2021-07-14: 13:00:00 1 via OPHTHALMIC

## 2021-07-14 MED ORDER — MOXIFLOXACIN HCL 0.5 % OP SOLN
0.5 % | OPHTHALMIC | Status: DC | PRN
Start: 2021-07-14 — End: 2021-07-14
  Administered 2021-07-14: 15:00:00 1 via OPHTHALMIC

## 2021-07-14 MED ORDER — MIDAZOLAM HCL 2 MG/2ML IJ SOLN
2 MG/ML | INTRAMUSCULAR | Status: DC | PRN
Start: 2021-07-14 — End: 2021-07-14
  Administered 2021-07-14: 15:00:00 2 via INTRAVENOUS

## 2021-07-14 MED ORDER — TETRACAINE HCL 0.5 % OP SOLN
0.5 % | OPHTHALMIC | Status: DC | PRN
Start: 2021-07-14 — End: 2021-07-14
  Administered 2021-07-14: 15:00:00 1 via OPHTHALMIC

## 2021-07-14 MED FILL — MIDAZOLAM HCL 2 MG/2ML IJ SOLN: 2 MG/ML | INTRAMUSCULAR | Qty: 2

## 2021-07-14 MED FILL — SODIUM CHLORIDE FLUSH 0.9 % IV SOLN: 0.9 % | INTRAVENOUS | Qty: 40

## 2021-07-14 NOTE — Anesthesia Pre-Procedure Evaluation (Signed)
Department of Anesthesiology  Preprocedure Note       Name:  LENETTE RAU   Age:  76 y.o.  DOB:  31-May-1945                                          MRN:  [id number]         Date:  07/14/2021      Surgeon: Juliann Mule):  Renato Battles, MD    Procedure: Procedure(s):  PHACO IOL OF THE LEFT EYE    Medications prior to admission:   Prior to Admission medications    Medication Sig Start Date End Date Taking? Authorizing Provider   famotidine (PEPCID) 20 MG tablet Take 1 tablet by mouth 2 times daily 12/17/19   Ar Automatic Reconciliation   levothyroxine (SYNTHROID) 25 MCG tablet TAKE 2 TABLETS ONCE DAILY 11/26/19   Ar Automatic Reconciliation   losartan-hydroCHLOROthiazide (HYZAAR) 50-12.5 MG per tablet Take 1 tablet by mouth daily 02/07/20   Ar Automatic Reconciliation       Current medications:    Current Facility-Administered Medications   Medication Dose Route Frequency Provider Last Rate Last Admin   . sodium chloride flush 0.9 % injection 5-40 mL  5-40 mL IntraVENous 2 times per day Renato Battles, MD           Allergies:    Allergies   Allergen Reactions   . Aspirin      Other reaction(s): Unknown (comments)  GI symptoms - ok to take   . Codeine      Other reaction(s): Unknown (comments)       Problem List:  There is no problem list on file for this patient.      Past Medical History:        Diagnosis Date   . Heart disease    . Hypertension    . Thyroid disease        Past Surgical History:        Procedure Laterality Date   . EYE SURGERY Right 06/23/2021    PHACO IOL OD performed by Renato Battles, MD at [facility] MAIN OR       Social History:    Social History     Tobacco Use   . Smoking status: Never   . Smokeless tobacco: Never   Substance Use Topics   . Alcohol use: Never                                Counseling given: Not Answered      Vital Signs (Current):   Vitals:    06/24/21 1036 07/14/21 0918   BP:  (!) 161/84   Pulse:  70   Resp:  16   Temp:  36.4 C (97.6 F)   TempSrc:  Temporal   SpO2:   99%   Weight:  142 lb (64.4 kg)   Height: _0  (1.626 m) _1  (1.626 m)                                              BP Readings from Last 3 Encounters:   07/14/21 (!) 161/84   06/23/21 135/63  NPO Status: Time of last liquid consumption: 2000                        Time of last solid consumption: 2000                        Date of last liquid consumption: 07/13/21                        Date of last solid food consumption: 07/13/21    BMI:   Wt Readings from Last 3 Encounters:   07/14/21 142 lb (64.4 kg)   05/28/21 143 lb (64.9 kg)   02/18/20 135 lb (61.2 kg)     Body mass index is 24.37 kg/m.    CBC: No results found for: WBC, RBC, HGB, HCT, MCV, RDW, PLT    CMP: No results found for: NA, K, CL, CO2, BUN, CREATININE, GFRAA, AGRATIO, LABGLOM, GLUCOSE, GLU, PROT, CALCIUM, BILITOT, ALKPHOS, AST, ALT    POC Tests: No results for input(s): POCGLU, POCNA, POCK, POCCL, POCBUN, POCHEMO, POCHCT in the last 72 hours.    Coags: No results found for: PROTIME, INR, APTT    HCG (If Applicable): No results found for: PREGTESTUR, PREGSERUM, HCG, HCGQUANT     ABGs: No results found for: PHART, PO2ART, PCO2ART, HCO3ART, BEART, O2SATART     Type & Screen (If Applicable):  No results found for: LABABO, LABRH    Drug/Infectious Status (If Applicable):  No results found for: HIV, HEPCAB    COVID-19 Screening (If Applicable): No results found for: COVID19        Anesthesia Evaluation  Patient summary reviewed and Nursing notes reviewed  Airway: Mallampati: II  TM distance: >3 FB   Neck ROM: full  Mouth opening: > = 3 FB   Dental:          Pulmonary:Negative Pulmonary ROS and normal exam  breath sounds clear to auscultation                             Cardiovascular:Negative CV ROS  Exercise tolerance: good (>4 METS),   (+) hypertension:,         Rhythm: regular  Rate: normal                    Neuro/Psych:   Negative Neuro/Psych ROS              GI/Hepatic/Renal: Neg GI/Hepatic/Renal ROS            Endo/Other: Negative  Endo/Other ROS                    Abdominal: normal exam            Vascular: negative vascular ROS.         Other Findings:           Anesthesia Plan      MAC     ASA 2             Anesthetic plan and risks discussed with patient.      Plan discussed with surgical team.                    Caro Laroche, APRN - CRNA   07/14/2021

## 2021-07-14 NOTE — Discharge Instructions (Signed)
POST-OPERATIVE INSTRUCTIONS FOR CATARACT SURGERY     1. No heavy lifting (no more than 5 pounds). No Bending at waist and No strenuous activity for one (1) week.     2. DO NOT RUB YOUR EYE!!! Wear eye protection at all times when going outdoors (glasses, sunglasses,        ect) and wear shield while sleeping/napping for the first week after surgery.      3. DO NOT GET WATER IN YOUR EYES FOR THE NEXT 3 DAYS. You may gently clean your face and you        may shower, but don't put any pressure on the eye. Ladies, no eye makeup for one (1) week after surgery.        Do not swim or get into a hot tub for three (3) weeks.     4. You may experience eye irritation, aching, itching and blurred vision for several days. Use Tylenol for         Discomfort but DO NOT RUB YOUR EYE .     5. Call your doctor with any increased pain, redness, nausea, vomiting, or worsening vision. Also call your doctor        with new flashes of light, many new floaters or a curtain/veil coming into your vision.                                               WASH YOUR HANDS BEFORE PUTTING IN YOUR DROPS                                       ALLOW 5 MINUTES BETWEEN DIFFERENT DROPS                       IF YOU HAVE ANY QUESTION OR CONCERNS DURING OFFICE HOUR                      CALL [phone] OR [phone] AFTER HOURS OR ON WEEKENDS

## 2021-07-14 NOTE — Op Note (Signed)
OPERATIVE REPORT    PATIENT INFORMATION:    Patient: Deborah Ingram: [id number] SSN: [id number]  Date of Birth: Jan 15, 1946 Age: 76 y.o. Sex: female      LOCATION OF SURGERY: [facility] [address] [place] [address] [place]:  07/14/21      PRE-OPERATIVE DIAGNOSIS: Cataract Left eye.  POST OPERATIVE DIAGNOSIS: Cataract Left eye.      PROCEDURE PERFORMED: Phacoemulsification with intraocular lens Left eye.  SURGEON: Dannielle Burn, M.D.  ANESTHESIA: Monitored anesthesia.  COMPLICATIONS: None.  BLOOD LOSS: None      INDICATIONS FOR PROCEDURE:  This is a 76 y.o. year old female with progressively decreasing vision in the left eye. Risks, benefits and alternatives of surgery were explained at length with the patient and informed consent was obtained.       PROCEDURE:  The patient was met in the pre-operative holding area where confirmation was made that the left eye was to be operated on and surgical eye was marked. The patient was taken to the operating room where anesthesia staff was present to give temporary sedation. Following the instillation of topical tetracaine drops to the operative eye the patient was then prepped and draped in the usual sterile fashion for ophthalmic surgery. The lid speculum was placed and the lids were retracted.  A paracentesis was made through the clear cornea, shugarcaine was injected in the eye for improved dilation. The anterior chamber was deepened with viscoelastic material. A 2.4 mm keratome was used to make a self-sealing clear corneal incision. Capsulorrhexis was initiated with a cystotome and completed with Utrata forceps without difficulty. Hydrodissection was completed and good fluid wave was visualized. Phacoemulsification was initiated and completed in a divide and conquer fashion without difficulty. Irrigation and aspiration was used to remove the residual cortical material from the capsular bag and then the capsular  bag and the anterior chamber were deepened with viscoelastic material. The bag was inspected and found to be free of any tears or defects. The intraocular lens implant listed below was placed into the capsular bag with good centration. Irrigation and aspiration was used to remove the residual viscoelastic material from the capsular bag and the anterior chamber and the incision sites were hydrated the BSS and cannula.  The incisions were rehydrated and inspected with a weck-cel sponge and were found to be water tight. The eye was inspected and the anterior chamber was deep, the pupil round, and the lens was in good position. The lid speculum was then removed under visualization. Several drops of antibiotic eye drops were instilled into patient's operated eye. The operated eye was then covered with an eyeshield. The patient tolerated the procedure well and was transferred to recovery in good condition.    Implant Name Type Inv. Item Serial No. Manufacturer Lot No. LRB No. Used Action   LENS IOL TECNIS 1-PIECE ACRYLIC [id number] - [id number]  LENS IOL TECNIS 1-PIECE ACRYLIC [id number] [employer]WD  Left 1 Implanted       Renato Battles, MD  07/14/21 11:11 AM

## 2021-07-14 NOTE — H&P (Signed)
Day of Surgery H&P:  Deborah Ingram was seen and examined.  There have been no significant clinical changes since the completion of the exam in the office.  Pt continues to complain of persistent poor vision and/or glare in planned operative eye affecting ability to perform basic ADLs (such as reading or driving at night).        Past Medical History:   Diagnosis Date    Heart disease     Hypertension     Thyroid disease        Family Medical History: Non-contributory    HOME MED LIST:  Prior to Admission medications    Medication Sig Start Date End Date Taking? Authorizing Provider   famotidine (PEPCID) 20 MG tablet Take 1 tablet by mouth 2 times daily 12/17/19   Ar Automatic Reconciliation   levothyroxine (SYNTHROID) 25 MCG tablet TAKE 2 TABLETS ONCE DAILY 11/26/19   Ar Automatic Reconciliation   losartan-hydroCHLOROthiazide (HYZAAR) 50-12.5 MG per tablet Take 1 tablet by mouth daily 02/07/20   Ar Automatic Reconciliation       Allergies   Allergen Reactions    Aspirin      Other reaction(s): Unknown (comments)  GI symptoms - ok to take    Codeine      Other reaction(s): Unknown (comments)       Review of Systems  Constitutional:  Negative for chills and fever.  HENT:  Negative for congestion and sore throat.    Eyes:  Positive for blurred vision.  Respiratory:  Negative for cough and shortness of breath.    Cardiovascular:  Negative for chest pain and palpitations.  Gastrointestinal:  Negative for nausea and vomiting.  Genitourinary:  Negative for dysuria.  Musculoskeletal:  Negative for myalgias.  Skin:  Negative for rash.  Neurological:  Negative for dizziness and headaches.      Vitals:    07/14/21 0918   BP: (!) 161/84   Pulse: 70   Resp: 16   Temp: 97.6 F (36.4 C)   SpO2: 99%       Alert & Oriented x 3  CV: Regular Rate, Regular rhythm, no murmurs/rubs/gallops  Pulm: Clear to auscultation bilaterally, no wheezes/rales/rhonchi  Neuro: CNII-XII grossly intact      Proceed with Surgery as planned.      Signed [id number] Huntley Estelle, MD   07/14/21, 9:36 AM

## 2021-07-14 NOTE — Anesthesia Post-Procedure Evaluation (Signed)
Department of Anesthesiology  Postprocedure Note    Patient: Deborah Ingram  MRN: [id number]  Birthdate: 03-Jun-1945  Date of evaluation: 07/14/2021      Procedure Summary     Date: 07/14/21 Room / Location: [facility] 02 / [facility]    Anesthesia Start: 1035 Anesthesia Stop: 1058    Procedure: PHACO IOL OF THE LEFT EYE (Left: Eye) Diagnosis:       Nuclear sclerotic cataract of left eye      (Nuclear sclerotic cataract of left eye [H25.12])    Surgeons: Renato Battles, MD Responsible Provider: Caro Laroche, APRN - CRNA    Anesthesia Type: MAC ASA Status: 2          Anesthesia Type: MAC    Aldrete Phase I: Aldrete Score: 10    Aldrete Phase II:        Anesthesia Post Evaluation    Patient location during evaluation: bedside  Patient participation: complete - patient participated  Level of consciousness: awake  Pain score: 0  Airway patency: patent  Nausea & Vomiting: no nausea  Complications: no  Cardiovascular status: blood pressure returned to baseline  Respiratory status: acceptable  Hydration status: euvolemic

## 2021-08-02 DIAGNOSIS — E119 Type 2 diabetes mellitus without complications: Secondary | ICD-10-CM | POA: Diagnosis not present

## 2021-08-02 DIAGNOSIS — H401132 Primary open-angle glaucoma, bilateral, moderate stage: Secondary | ICD-10-CM | POA: Diagnosis not present

## 2021-08-02 DIAGNOSIS — Z961 Presence of intraocular lens: Secondary | ICD-10-CM | POA: Diagnosis not present

## 2021-08-17 DIAGNOSIS — E782 Mixed hyperlipidemia: Secondary | ICD-10-CM | POA: Diagnosis not present

## 2021-08-17 DIAGNOSIS — I10 Essential (primary) hypertension: Secondary | ICD-10-CM | POA: Diagnosis not present

## 2021-08-17 DIAGNOSIS — E118 Type 2 diabetes mellitus with unspecified complications: Secondary | ICD-10-CM | POA: Diagnosis not present

## 2021-08-18 DIAGNOSIS — I10 Essential (primary) hypertension: Secondary | ICD-10-CM | POA: Diagnosis not present

## 2021-08-18 DIAGNOSIS — N261 Atrophy of kidney (terminal): Secondary | ICD-10-CM | POA: Diagnosis not present

## 2021-08-18 DIAGNOSIS — Z7184 Encounter for health counseling related to travel: Secondary | ICD-10-CM | POA: Diagnosis not present

## 2021-08-18 DIAGNOSIS — Z23 Encounter for immunization: Secondary | ICD-10-CM | POA: Diagnosis not present

## 2021-08-18 DIAGNOSIS — Z9889 Other specified postprocedural states: Secondary | ICD-10-CM | POA: Diagnosis not present

## 2021-08-18 DIAGNOSIS — Z0000 Encounter for general adult medical examination without abnormal findings: Secondary | ICD-10-CM | POA: Diagnosis not present

## 2021-08-18 DIAGNOSIS — E1121 Type 2 diabetes mellitus with diabetic nephropathy: Secondary | ICD-10-CM | POA: Diagnosis not present

## 2021-08-18 DIAGNOSIS — E213 Hyperparathyroidism, unspecified: Secondary | ICD-10-CM | POA: Diagnosis not present

## 2021-08-19 ENCOUNTER — Other Ambulatory Visit (HOSPITAL_BASED_OUTPATIENT_CLINIC_OR_DEPARTMENT_OTHER): Payer: Self-pay | Admitting: Internal Medicine

## 2021-08-19 ENCOUNTER — Other Ambulatory Visit: Payer: Self-pay | Admitting: Internal Medicine

## 2021-08-19 DIAGNOSIS — I10 Essential (primary) hypertension: Secondary | ICD-10-CM

## 2021-08-23 ENCOUNTER — Telehealth: Payer: Self-pay | Admitting: Podiatry

## 2021-08-23 ENCOUNTER — Ambulatory Visit (HOSPITAL_BASED_OUTPATIENT_CLINIC_OR_DEPARTMENT_OTHER)
Admission: RE | Admit: 2021-08-23 | Discharge: 2021-08-23 | Disposition: A | Discharge status: Home / Self Care | Payer: Medicare Other | Source: Ambulatory Visit | Attending: Internal Medicine | Admitting: Internal Medicine

## 2021-08-23 DIAGNOSIS — I10 Essential (primary) hypertension: Secondary | ICD-10-CM | POA: Insufficient documentation

## 2021-08-23 NOTE — Telephone Encounter (Signed)
Pt said shes been waiting for her Diabetic shoes since April and left voicemails and nobody has gotten in touch with her to follow up if they are ready.

## 2021-08-24 DIAGNOSIS — I10 Essential (primary) hypertension: Secondary | ICD-10-CM | POA: Diagnosis not present

## 2021-08-24 DIAGNOSIS — Z7902 Long term (current) use of antithrombotics/antiplatelets: Secondary | ICD-10-CM | POA: Diagnosis not present

## 2021-08-24 DIAGNOSIS — E1121 Type 2 diabetes mellitus with diabetic nephropathy: Secondary | ICD-10-CM | POA: Diagnosis not present

## 2021-08-24 DIAGNOSIS — I2510 Atherosclerotic heart disease of native coronary artery without angina pectoris: Secondary | ICD-10-CM | POA: Diagnosis not present

## 2021-08-24 DIAGNOSIS — E559 Vitamin D deficiency, unspecified: Secondary | ICD-10-CM | POA: Diagnosis not present

## 2021-08-24 DIAGNOSIS — I700 Atherosclerosis of aorta: Secondary | ICD-10-CM | POA: Diagnosis not present

## 2021-08-24 DIAGNOSIS — E782 Mixed hyperlipidemia: Secondary | ICD-10-CM | POA: Diagnosis not present

## 2021-09-08 DIAGNOSIS — I129 Hypertensive chronic kidney disease with stage 1 through stage 4 chronic kidney disease, or unspecified chronic kidney disease: Secondary | ICD-10-CM | POA: Diagnosis not present

## 2021-09-08 DIAGNOSIS — N2581 Secondary hyperparathyroidism of renal origin: Secondary | ICD-10-CM | POA: Diagnosis not present

## 2021-09-08 DIAGNOSIS — N1831 Chronic kidney disease, stage 3a: Secondary | ICD-10-CM | POA: Diagnosis not present

## 2021-09-08 DIAGNOSIS — E1122 Type 2 diabetes mellitus with diabetic chronic kidney disease: Secondary | ICD-10-CM | POA: Diagnosis not present

## 2021-09-08 DIAGNOSIS — E785 Hyperlipidemia, unspecified: Secondary | ICD-10-CM | POA: Diagnosis not present

## 2021-09-08 DIAGNOSIS — I701 Atherosclerosis of renal artery: Secondary | ICD-10-CM | POA: Diagnosis not present

## 2021-09-09 ENCOUNTER — Encounter: Payer: Self-pay | Admitting: Internal Medicine

## 2021-09-09 ENCOUNTER — Ambulatory Visit: Payer: Medicare Other | Admitting: Internal Medicine

## 2021-09-09 VITALS — BP 130/77 | HR 76 | Temp 97.8°F | Resp 16 | Ht 63.0 in | Wt 219.0 lb

## 2021-09-09 DIAGNOSIS — I25810 Atherosclerosis of coronary artery bypass graft(s) without angina pectoris: Secondary | ICD-10-CM | POA: Insufficient documentation

## 2021-09-09 DIAGNOSIS — R931 Abnormal findings on diagnostic imaging of heart and coronary circulation: Secondary | ICD-10-CM

## 2021-09-09 DIAGNOSIS — I10 Essential (primary) hypertension: Secondary | ICD-10-CM

## 2021-09-09 DIAGNOSIS — E118 Type 2 diabetes mellitus with unspecified complications: Secondary | ICD-10-CM | POA: Diagnosis not present

## 2021-09-09 MED ORDER — METOPROLOL SUCCINATE ER 50 MG PO TB24: 50.0000 mg | ORAL_TABLET | Freq: Every day | ORAL | 2 refills | Status: DC

## 2021-09-09 MED ORDER — ROSUVASTATIN CALCIUM 40 MG PO TABS: 40.0000 mg | ORAL_TABLET | Freq: Every day | ORAL | 3 refills | Status: DC

## 2021-09-09 NOTE — Progress Notes (Signed)
Primary Physician/Referring:  Deland Pretty, MD  Patient ID: [name], female    DOB: [birth year], 76 y.o.   MRN: [id number]  Chief Complaint  Patient presents with   Coronary Artery Disease   New Patient (Initial Visit)   HPI:    [name]  is a 76 y.o. female with past medical history significant for hypertension, diabetes, and obesity who is here to establish care with cardiology.  Patient recently had coronary artery calcium scoring which was significantly elevated, especially in the LAD.  Her score puts her in the 99th percentile for risk in her age group.  She is compliant with all of her medications.  She has never had an echocardiogram or stress test in the past and is agreeable to this.  Her blood pressure is well-controlled.  Patient agreeable to increasing her cholesterol medication and switching to Toprol XL.  She states she has been trying to do some light walking on the treadmill for about 30 minutes.  She does admit that her eating habits are not great.  She does follow with her family doctor regularly.  She denies chest pain, shortness of breath, palpitations, diaphoresis, syncope, edema, PND, orthopnea.   Past Medical History:  Diagnosis Date   Diabetes mellitus    GERD (gastroesophageal reflux disease)    Glaucoma    Glaucoma    Hypercholesteremia    Hypertension    Obesity    Past Surgical History:  Procedure Laterality Date   ABDOMINAL HYSTERECTOMY     BREAST EXCISIONAL BIOPSY Left    benign   EYE SURGERY     TONSILLECTOMY     Family History  Problem Relation Age of Onset   Heart disease Mother    Cancer Mother    Hypertension Sister    Hypertension Sister    Heart disease Sister    Hypertension Sister    Hypertension Sister    Hypertension Brother    Hypertension Brother    Breast cancer Maternal Aunt    Cancer Other    Hyperlipidemia Other    Hypercalcemia Neg Hx     Social History   Tobacco Use   Smoking status: Never    Smokeless tobacco: Never  Substance Use Topics   Alcohol use: No   Marital Status: Married  ROS  Review of Systems  Constitutional: Negative.  Cardiovascular: Negative.   Respiratory: Negative.    Gastrointestinal: Negative.   Objective  Blood pressure 130/77, pulse 76, temperature 97.8 F (36.6 C), resp. rate 16, height '5\' 3"'$  (1.6 m), weight 219 lb (99.3 kg), SpO2 100 %. Body mass index is 38.79 kg/m.     09/09/2021    9:50 AM 09/09/2021    9:38 AM 04/21/2020   12:47 PM  Vitals with BMI  Height  '5\' 3"'$  '5\' 3"'$   Weight  219 lbs 217 lbs  BMI  51.8 84.16  Systolic 606 301 601  Diastolic 77 83 96  Pulse 76 78 73     Physical Exam Constitutional:      General: She is not in acute distress.    Appearance: Normal appearance. She is obese.  HENT:     Head: Normocephalic and atraumatic.  Eyes:     Extraocular Movements: Extraocular movements intact.  Neck:     Vascular: No carotid bruit.  Cardiovascular:     Rate and Rhythm: Normal rate and regular rhythm.     Pulses: Normal pulses.     Heart sounds: Normal heart  sounds. No murmur heard.    No friction rub. No gallop.  Pulmonary:     Effort: Pulmonary effort is normal.     Breath sounds: Normal breath sounds.  Abdominal:     General: Bowel sounds are normal.     Palpations: Abdomen is soft.  Skin:    General: Skin is warm and dry.  Neurological:     Mental Status: She is alert.   Medications and allergies   Allergies  Allergen Reactions   Methazolamide Rash   Sulfa Antibiotics Other (See Comments)    PATIENT HAS TO MONITOR THE USE OF ANY SULFA PRODUCTS.  IF IT TOUCHES HER SCALP IT CAUSES HER GREAT PAIN AND HEAT.  NOT SURE OF OTHER REACTIONS THAT SHE HAS. Severe head pain with hair products    Acetaminophen    Hydrocodone    Hydrocodone-Acetaminophen     Out of body experience   Sulfonamide Derivatives     Severe head pain with hair products   Latex Rash     Medication list after today's encounter   Current  Outpatient Medications:    acetaminophen (TYLENOL 8 HOUR) 650 MG CR tablet, 1 tablet, Disp: , Rfl:    AgaMatrix Ultra-Thin Lancets MISC, USE   TO CHECK GLUCOSE TWICE DAILY AS DIRECTED, Disp: , Rfl:    albuterol (VENTOLIN HFA) 108 (90 Base) MCG/ACT inhaler, Inhale into the lungs., Disp: , Rfl:    amLODipine (NORVASC) 5 MG tablet, Take by mouth., Disp: , Rfl:    aspirin EC 81 MG tablet, Take 81 mg by mouth daily., Disp: , Rfl:    brimonidine-timolol (COMBIGAN) 0.2-0.5 % ophthalmic solution, Place 1 drop into both eyes every 12 (twelve) hours., Disp: , Rfl:    Cetirizine HCl 10 MG CAPS, Take by mouth., Disp: , Rfl:    dapagliflozin propanediol (FARXIGA) 10 MG TABS tablet, 1 tablet, Disp: , Rfl:    esomeprazole (NEXIUM) 40 MG capsule, Take 40 mg by mouth daily as needed. Acid reflux, Disp: , Rfl:    glucose blood (ONETOUCH VERIO) test strip, USE 1 STRIP TO CHECK GLUCOSE TWICE DAILY, Disp: , Rfl:    glucose blood (ONETOUCH VERIO) test strip, USE 1 STRIP TO CHECK GLUCOSE TWICE DAILY AS DIRECTED, Disp: , Rfl:    irbesartan (AVAPRO) 300 MG tablet, Take 1 tablet by mouth daily., Disp: , Rfl:    latanoprost (XALATAN) 0.005 % ophthalmic solution, 1 drop into each eye in the evening, Disp: , Rfl:    metFORMIN (GLUCOPHAGE) 500 MG tablet, Take 500 mg by mouth 2 (two) times daily with a meal., Disp: , Rfl:    metFORMIN (GLUCOPHAGE-XR) 500 MG 24 hr tablet, Take 1 tablet by mouth 2 (two) times daily., Disp: , Rfl:    metoprolol succinate (TOPROL-XL) 50 MG 24 hr tablet, Take 1 tablet (50 mg total) by mouth at bedtime. Take with or immediately following a meal., Disp: 30 tablet, Rfl: 2   pregabalin (LYRICA) 50 MG capsule, Take 50 mg by mouth at bedtime., Disp: , Rfl:    rosuvastatin (CRESTOR) 40 MG tablet, Take 1 tablet (40 mg total) by mouth daily., Disp: 90 tablet, Rfl: 3   Semaglutide, 1 MG/DOSE, (OZEMPIC, 1 MG/DOSE,) 4 MG/3ML SOPN, 1 mg, Disp: , Rfl:    torsemide (DEMADEX) 20 MG tablet, Take 1 tablet by mouth  daily., Disp: , Rfl:   Laboratory examination:   Lab Results  Component Value Date   NA 140 04/02/2020   K 3.7 04/02/2020  CO2 28 04/02/2020   GLUCOSE 172 (H) 04/02/2020   BUN 24 (H) 04/02/2020   CREATININE 1.42 (H) 04/02/2020   CALCIUM 10.2 04/02/2020   CALCIUM 10.2 04/02/2020   GFRNONAA 42 (L) 03/08/2015       Latest Ref Rng & Units 04/02/2020    3:24 PM 03/08/2015    9:55 AM 06/01/2013    7:40 PM  CMP  Glucose 70 - 99 mg/dL 172  291  221   BUN 6 - 23 mg/dL '24  30  19   '$ Creatinine 0.40 - 1.20 mg/dL 1.42  1.28  1.00   Sodium 135 - 145 mEq/L 140  139  139   Potassium 3.5 - 5.1 mEq/L 3.7  4.6  4.3   Chloride 96 - 112 mEq/L 103  105  102   CO2 19 - 32 mEq/L '28  26  23   '$ Calcium 8.4 - 10.5 mg/dL 8.6 - 10.4 mg/dL 10.2    10.2  9.9  10.3   Total Protein 6.0 - 8.3 g/dL   7.7   Total Bilirubin 0.3 - 1.2 mg/dL   0.3   Alkaline Phos 39 - 117 U/L   107   AST 0 - 37 U/L   19   ALT 0 - 35 U/L   17       Latest Ref Rng & Units 03/08/2015    9:55 AM 06/01/2013    7:40 PM 04/02/2013   11:15 AM  CBC  WBC 4.0 - 10.5 K/uL 5.1  6.5  6.5   Hemoglobin 12.0 - 15.0 g/dL 12.8  12.4  13.3   Hematocrit 36.0 - 46.0 % 39.8  37.3  39.4   Platelets 150 - 400 K/uL 191  230  205     Lipid Panel No results for input(s): "CHOL", "TRIG", "[place]", "VLDL", "HDL", "CHOLHDL", "LDLDIRECT" in the last 8760 hours.  HEMOGLOBIN A1C Lab Results  Component Value Date   HGBA1C (H) 05/09/2008    6.5 (NOTE) The ADA recommends the following therapeutic goal for glycemic control related to Hgb A1c measurement: Goal of therapy: <6.5 Hgb A1c  Reference: American Diabetes Association: Clinical Practice Recommendations 2010, Diabetes Care, 2010, 33: (Suppl  1).   MPG 140 05/09/2008   TSH No results for input(s): "TSH" in the last 8760 hours.  External labs:     Radiology:    Cardiac Studies:   No results found for this or any previous visit from the past 1095 days.     No results found for this or any  previous visit from the past 1095 days.   CCS 07/2021 IMPRESSION: 1. Coronary calcium score of 2409. This was 99th percentile for age-, race-, and sex-matched controls. 2. Aortic atherosclerosis and aortic valve calcification. 3. Borderline dilated aorta at 37 mm (non-contrast, level of the main PA bifurcation).    EKG:   09/09/2021 EKG - NSR, low voltage  Assessment     ICD-10-CM   1. Coronary artery disease involving coronary bypass graft of native heart without angina pectoris  I25.810 EKG 12-Lead    PCV ECHOCARDIOGRAM COMPLETE    PCV MYOCARDIAL PERFUSION WO LEXISCAN    2. High coronary artery calcium score  R93.1 PCV ECHOCARDIOGRAM COMPLETE    PCV MYOCARDIAL PERFUSION WO LEXISCAN    3. Essential hypertension  I10     4. Type 2 diabetes mellitus with complication, without long-term current use of insulin ([place])  E11.8        Orders Placed This  Encounter  Procedures   PCV MYOCARDIAL PERFUSION WO LEXISCAN    Standing Status:   Future    Standing Expiration Date:   11/09/2021    Order Specific Question:   Who will be the designated reader for this study?    Answer:   Vernell Leep   EKG 12-Lead   PCV ECHOCARDIOGRAM COMPLETE    Standing Status:   Future    Standing Expiration Date:   09/10/2022    Meds ordered this encounter  Medications   metoprolol succinate (TOPROL-XL) 50 MG 24 hr tablet    Sig: Take 1 tablet (50 mg total) by mouth at bedtime. Take with or immediately following a meal.    Dispense:  30 tablet    Refill:  2   rosuvastatin (CRESTOR) 40 MG tablet    Sig: Take 1 tablet (40 mg total) by mouth daily.    Dispense:  90 tablet    Refill:  3    Medications Discontinued During This Encounter  Medication Reason   aspirin EC 81 MG tablet    acetaminophen (TYLENOL) 325 MG tablet    acetaminophen-codeine (TYLENOL #2) 300-15 MG tablet    atorvastatin (LIPITOR) 20 MG tablet    amLODipine-valsartan (EXFORGE) 10-320 MG per tablet    bimatoprost (LUMIGAN)  0.03 % ophthalmic solution    brinzolamide (AZOPT) 1 % ophthalmic suspension    cyclobenzaprine (FLEXERIL) 5 MG tablet    glipiZIDE (GLUCOTROL XL) 5 MG 24 hr tablet    meloxicam (MOBIC) 15 MG tablet    oxyCODONE-acetaminophen (PERCOCET/ROXICET) 5-325 MG per tablet    vitamin E 400 UNIT capsule    chlorhexidine (PERIDEX) 0.12 % solution    cloNIDine (CATAPRES) 0.1 MG tablet    cloNIDine (CATAPRES) 0.1 MG tablet    hydrocortisone (ANUSOL-HC) 2.5 % rectal cream    hydrOXYzine (ATARAX) 25 MG tablet    ibuprofen (ADVIL) 800 MG tablet    ibuprofen (ADVIL,MOTRIN) 600 MG tablet    polyethylene glycol (MIRALAX / GLYCOLAX) packet    rosuvastatin (CRESTOR) 5 MG tablet    nebivolol (BYSTOLIC) 5 MG tablet      Recommendations:   [name] is a 76 y.o.  F with significantly elevated CCS, placing her in the 99th percentile risk for her age.  Continue current cardiac medications with the following changes: stop bystolic, start Metoprolol-XL '50mg'$  and Crestor increased to '40mg'$ . Encourage low-sodium diet, less than 2000 mg daily. Schedule imaging tests in office: echo and nuclear exercise stress. Follow-up in 1-2 months or sooner if needed.     Caitlin Flock, DO  09/09/2021, 10:15 AM Office: [phone] Pager: [phone]

## 2021-09-09 NOTE — Patient Instructions (Addendum)
Continue current cardiac medications with the following changes: stop bystolic, start Metoprolol-XL '50mg'$  and Crestor increased to '40mg'$ . Encourage low-sodium diet, less than 2000 mg daily. Schedule imaging tests in office: echo and nuclear exercise stress. Follow-up in 1-2 months or sooner if needed.

## 2021-09-29 ENCOUNTER — Ambulatory Visit: Payer: Medicare Other

## 2021-09-29 ENCOUNTER — Other Ambulatory Visit: Payer: Medicare Other

## 2021-09-29 DIAGNOSIS — G5600 Carpal tunnel syndrome, unspecified upper limb: Secondary | ICD-10-CM | POA: Diagnosis not present

## 2021-09-29 DIAGNOSIS — I25810 Atherosclerosis of coronary artery bypass graft(s) without angina pectoris: Secondary | ICD-10-CM | POA: Diagnosis not present

## 2021-09-29 DIAGNOSIS — M549 Dorsalgia, unspecified: Secondary | ICD-10-CM | POA: Diagnosis not present

## 2021-09-29 DIAGNOSIS — R931 Abnormal findings on diagnostic imaging of heart and coronary circulation: Secondary | ICD-10-CM

## 2021-09-29 DIAGNOSIS — M79641 Pain in right hand: Secondary | ICD-10-CM | POA: Diagnosis not present

## 2021-09-29 DIAGNOSIS — N1831 Chronic kidney disease, stage 3a: Secondary | ICD-10-CM | POA: Diagnosis not present

## 2021-09-29 DIAGNOSIS — M150 Primary generalized (osteo)arthritis: Secondary | ICD-10-CM | POA: Diagnosis not present

## 2021-10-05 ENCOUNTER — Ambulatory Visit: Payer: Medicare Other

## 2021-10-05 ENCOUNTER — Ambulatory Visit (INDEPENDENT_AMBULATORY_CARE_PROVIDER_SITE_OTHER): Payer: Medicare Other | Admitting: *Deleted

## 2021-10-05 DIAGNOSIS — M19079 Primary osteoarthritis, unspecified ankle and foot: Secondary | ICD-10-CM | POA: Diagnosis not present

## 2021-10-05 DIAGNOSIS — L989 Disorder of the skin and subcutaneous tissue, unspecified: Secondary | ICD-10-CM

## 2021-10-05 DIAGNOSIS — M2141 Flat foot [pes planus] (acquired), right foot: Secondary | ICD-10-CM

## 2021-10-05 DIAGNOSIS — M2142 Flat foot [pes planus] (acquired), left foot: Secondary | ICD-10-CM | POA: Diagnosis not present

## 2021-10-05 DIAGNOSIS — E0843 Diabetes mellitus due to underlying condition with diabetic autonomic (poly)neuropathy: Secondary | ICD-10-CM | POA: Diagnosis not present

## 2021-10-05 DIAGNOSIS — I25810 Atherosclerosis of coronary artery bypass graft(s) without angina pectoris: Secondary | ICD-10-CM

## 2021-10-05 DIAGNOSIS — R931 Abnormal findings on diagnostic imaging of heart and coronary circulation: Secondary | ICD-10-CM

## 2021-10-05 NOTE — Progress Notes (Signed)
Patient presents today to pick up diabetic shoes and insoles.  Patient was dispensed 1 pair of diabetic shoes and 3 pairs of foam casted diabetic insoles. Fit was satisfactory. Instructions for break-in and wear was reviewed and a copy was given to the patient.   Re-appointment for regularly scheduled diabetic foot care visits or if they should experience any trouble with the shoes or insoles.  

## 2021-10-21 ENCOUNTER — Ambulatory Visit: Payer: Medicare Other | Admitting: Internal Medicine

## 2021-10-21 ENCOUNTER — Encounter: Payer: Self-pay | Admitting: Internal Medicine

## 2021-10-21 VITALS — BP 165/89 | HR 79 | Temp 98.0°F | Resp 16 | Ht 63.0 in | Wt 218.0 lb

## 2021-10-21 DIAGNOSIS — N1831 Chronic kidney disease, stage 3a: Secondary | ICD-10-CM

## 2021-10-21 DIAGNOSIS — E7800 Pure hypercholesterolemia, unspecified: Secondary | ICD-10-CM

## 2021-10-21 DIAGNOSIS — I10 Essential (primary) hypertension: Secondary | ICD-10-CM | POA: Diagnosis not present

## 2021-10-21 DIAGNOSIS — I25810 Atherosclerosis of coronary artery bypass graft(s) without angina pectoris: Secondary | ICD-10-CM

## 2021-10-21 DIAGNOSIS — E118 Type 2 diabetes mellitus with unspecified complications: Secondary | ICD-10-CM

## 2021-10-21 DIAGNOSIS — R931 Abnormal findings on diagnostic imaging of heart and coronary circulation: Secondary | ICD-10-CM | POA: Diagnosis not present

## 2021-10-21 MED ORDER — METOPROLOL SUCCINATE ER 100 MG PO TB24: 50.0000 mg | ORAL_TABLET | Freq: Every day | ORAL | 2 refills | Status: DC

## 2021-10-21 MED ORDER — SPIRONOLACTONE 25 MG PO TABS: 25.0000 mg | ORAL_TABLET | Freq: Every day | ORAL | 3 refills | Status: DC

## 2021-10-21 MED ORDER — TORSEMIDE 40 MG PO TABS
20.0000 mg | ORAL_TABLET | Freq: Every day | ORAL | 3 refills | Status: DC
Start: 2021-10-21 — End: 2021-10-28

## 2021-10-21 NOTE — Progress Notes (Signed)
Primary Physician/Referring:  Deland Pretty, MD  Patient ID: [name], female    DOB: [birth year], 76 y.o.   MRN: [id number]  Chief Complaint  Patient presents with   Coronary Artery Disease   Follow-up    6 weeks   HPI:    [name]  is a 76 y.o. female with past medical history significant for hypertension, diabetes, and obesity. Patient recently had coronary artery calcium scoring which was significantly elevated at 2409, especially in the LAD (1232).  Her score puts her in the 99th percentile for risk in her age group.  She is compliant with all of her medications. She has never had an echocardiogram or stress test in the past and is agreeable to this.  Her blood pressure is well-controlled. She states she has been trying to do some light walking on the treadmill for about 30 minutes.  She is following a healthy diet. She does follow with her family doctor regularly.  She denies chest pain, shortness of breath, palpitations, diaphoresis, syncope, edema, PND, orthopnea.   At last visit she was started on MetoprololXL '50mg'$  daily and Crestor was increased to '40mg'$  daily. She did not tolerate the increased dose of Crestor due to muscle aches, headaches, and fatigue so she has been taking '20mg'$  daily. She does monitor her blood pressure at home and readings have been 150-160s/80s. She is here today for 6 week follow-up.  Past Medical History:  Diagnosis Date   Diabetes mellitus    GERD (gastroesophageal reflux disease)    Glaucoma    Glaucoma    Hypercholesteremia    Hypertension    Obesity    Past Surgical History:  Procedure Laterality Date   ABDOMINAL HYSTERECTOMY     BREAST EXCISIONAL BIOPSY Left    benign   EYE SURGERY     TONSILLECTOMY     Family History  Problem Relation Age of Onset   Heart disease Mother    Cancer Mother    Hypertension Sister    Hypertension Sister    Heart disease Sister    Hypertension Sister    Hypertension Sister    Hypertension  Brother    Hypertension Brother    Breast cancer Maternal Aunt    Cancer Other    Hyperlipidemia Other    Hypercalcemia Neg Hx     Social History   Tobacco Use   Smoking status: Never   Smokeless tobacco: Never  Substance Use Topics   Alcohol use: No   Marital Status: Married  ROS  Review of Systems  Constitutional: Negative. Negative for malaise/fatigue.  Cardiovascular:  Positive for leg swelling. Negative for chest pain, claudication, dyspnea on exertion, near-syncope, orthopnea, palpitations and syncope.  Respiratory: Negative.  Negative for shortness of breath.   Gastrointestinal: Negative.   Neurological:  Negative for dizziness.   Objective  Blood pressure (!) 165/89, pulse 79, temperature 98 F (36.7 C), resp. rate 16, height '5\' 3"'$  (1.6 m), weight 218 lb (98.9 kg), SpO2 98 %. Body mass index is 38.62 kg/m.     10/21/2021    9:42 AM 10/21/2021    9:36 AM 09/09/2021    9:50 AM  Vitals with BMI  Height  '5\' 3"'$    Weight  218 lbs   BMI  11.57   Systolic 262 035 597  Diastolic 89 93 77  Pulse 79 79 76     Physical Exam Constitutional:      General: She is not in acute distress.  Appearance: Normal appearance.  HENT:     Head: Normocephalic and atraumatic.  Eyes:     Extraocular Movements: Extraocular movements intact.  Neck:     Vascular: No carotid bruit.  Cardiovascular:     Rate and Rhythm: Normal rate and regular rhythm.     Pulses: Normal pulses.     Heart sounds: Normal heart sounds. No murmur heard.    No friction rub. No gallop.  Pulmonary:     Effort: Pulmonary effort is normal.     Breath sounds: Normal breath sounds.  Abdominal:     General: Bowel sounds are normal.     Palpations: Abdomen is soft.  Musculoskeletal:     Right lower leg: 1+ Edema present.     Left lower leg: 1+ Edema present.  Skin:    General: Skin is warm and dry.  Neurological:     Mental Status: She is alert.    Medications and allergies   Allergies  Allergen  Reactions   Methazolamide Rash   Sulfa Antibiotics Other (See Comments)    PATIENT HAS TO MONITOR THE USE OF ANY SULFA PRODUCTS.  IF IT TOUCHES HER SCALP IT CAUSES HER GREAT PAIN AND HEAT.  NOT SURE OF OTHER REACTIONS THAT SHE HAS. Severe head pain with hair products    Acetaminophen    Hydrocodone    Hydrocodone-Acetaminophen     Out of body experience   Sulfonamide Derivatives     Severe head pain with hair products   Latex Rash     Medication list after today's encounter   Current Outpatient Medications:    acetaminophen (TYLENOL 8 HOUR) 650 MG CR tablet, 1 tablet, Disp: , Rfl:    AgaMatrix Ultra-Thin Lancets MISC, USE   TO CHECK GLUCOSE TWICE DAILY AS DIRECTED, Disp: , Rfl:    amLODipine (NORVASC) 10 MG tablet, Take 10 mg by mouth daily., Disp: , Rfl:    aspirin EC 81 MG tablet, Take 81 mg by mouth daily., Disp: , Rfl:    brimonidine-timolol (COMBIGAN) 0.2-0.5 % ophthalmic solution, Place 1 drop into both eyes every 12 (twelve) hours., Disp: , Rfl:    dapagliflozin propanediol (FARXIGA) 10 MG TABS tablet, 1 tablet, Disp: , Rfl:    esomeprazole (NEXIUM) 40 MG capsule, Take 40 mg by mouth daily as needed. Acid reflux, Disp: , Rfl:    glucose blood (ONETOUCH VERIO) test strip, USE 1 STRIP TO CHECK GLUCOSE TWICE DAILY, Disp: , Rfl:    glucose blood (ONETOUCH VERIO) test strip, USE 1 STRIP TO CHECK GLUCOSE TWICE DAILY AS DIRECTED, Disp: , Rfl:    irbesartan (AVAPRO) 300 MG tablet, Take 1 tablet by mouth daily., Disp: , Rfl:    latanoprost (XALATAN) 0.005 % ophthalmic solution, 1 drop into each eye in the evening, Disp: , Rfl:    metFORMIN (GLUCOPHAGE-XR) 500 MG 24 hr tablet, Take 1 tablet by mouth 2 (two) times daily., Disp: , Rfl:    metoprolol succinate (TOPROL-XL) 50 MG 24 hr tablet, Take 1 tablet (50 mg total) by mouth at bedtime. Take with or immediately following a meal., Disp: 30 tablet, Rfl: 2   pregabalin (LYRICA) 50 MG capsule, Take 50 mg by mouth at bedtime., Disp: , Rfl:     rosuvastatin (CRESTOR) 40 MG tablet, Take 1 tablet (40 mg total) by mouth daily., Disp: 90 tablet, Rfl: 3   Semaglutide, 1 MG/DOSE, (OZEMPIC, 1 MG/DOSE,) 4 MG/3ML SOPN, 1 mg, Disp: , Rfl:    torsemide (DEMADEX) 20 MG  tablet, Take 1 tablet by mouth daily., Disp: , Rfl:   Laboratory examination:   Lab Results  Component Value Date   NA 140 04/02/2020   K 3.7 04/02/2020   CO2 28 04/02/2020   GLUCOSE 172 (H) 04/02/2020   BUN 24 (H) 04/02/2020   CREATININE 1.42 (H) 04/02/2020   CALCIUM 10.2 04/02/2020   CALCIUM 10.2 04/02/2020   GFRNONAA 42 (L) 03/08/2015       Latest Ref Rng & Units 04/02/2020    3:24 PM 03/08/2015    9:55 AM 06/01/2013    7:40 PM  CMP  Glucose 70 - 99 mg/dL 172  291  221   BUN 6 - 23 mg/dL '24  30  19   '$ Creatinine 0.40 - 1.20 mg/dL 1.42  1.28  1.00   Sodium 135 - 145 mEq/L 140  139  139   Potassium 3.5 - 5.1 mEq/L 3.7  4.6  4.3   Chloride 96 - 112 mEq/L 103  105  102   CO2 19 - 32 mEq/L '28  26  23   '$ Calcium 8.4 - 10.5 mg/dL 8.6 - 10.4 mg/dL 10.2    10.2  9.9  10.3   Total Protein 6.0 - 8.3 g/dL   7.7   Total Bilirubin 0.3 - 1.2 mg/dL   0.3   Alkaline Phos 39 - 117 U/L   107   AST 0 - 37 U/L   19   ALT 0 - 35 U/L   17       Latest Ref Rng & Units 03/08/2015    9:55 AM 06/01/2013    7:40 PM 04/02/2013   11:15 AM  CBC  WBC 4.0 - 10.5 K/uL 5.1  6.5  6.5   Hemoglobin 12.0 - 15.0 g/dL 12.8  12.4  13.3   Hematocrit 36.0 - 46.0 % 39.8  37.3  39.4   Platelets 150 - 400 K/uL 191  230  205     Lipid Panel No results for input(s): "CHOL", "TRIG", "[place]", "VLDL", "HDL", "CHOLHDL", "LDLDIRECT" in the last 8760 hours.  HEMOGLOBIN A1C Lab Results  Component Value Date   HGBA1C (H) 05/09/2008    6.5 (NOTE) The ADA recommends the following therapeutic goal for glycemic control related to Hgb A1c measurement: Goal of therapy: <6.5 Hgb A1c  Reference: American Diabetes Association: Clinical Practice Recommendations 2010, Diabetes Care, 2010, 33: (Suppl  1).   MPG 140  05/09/2008   TSH No results for input(s): "TSH" in the last 8760 hours.  External labs:   02/24/2021 Cholesterol 145  Triglycerides 110  HDL 56  LDL 67  Hgb A1c 6.9%  BUN 26 Creatinine 1.28  Estimated GFR 48  Radiology:    Cardiac Studies:  Echocardiogram 10/05/2021: Normal LV systolic function with visual EF 55-60%. Left ventricle cavity is normal in size. Moderate concentric hypertrophy of the left ventricle. Normal global wall motion. Doppler evidence of grade I (impaired) diastolic dysfunction, normal LAP. Calculated EF 57%. Left atrial cavity is mildly dilated. Trileaflet aortic valve.  Trace aortic regurgitation. Mild aortic valve leaflet calcification. Structurally normal mitral valve.  Mild (Grade I) mitral regurgitation. Structurally normal tricuspid valve.  Mild tricuspid regurgitation. No evidence of pulmonary hypertension. No prior available for comparison.   Exercise nuclear stress test 09/29/21 Myocardial perfusion is normal. Low risk study. Overall LV systolic function is normal without regional wall motion abnormalities. Stress LV EF: 55%.  Normal ECG stress. The patient exercised for 4 minutes and 29 seconds of  a Bruce protocol, achieving approximately 6.44 METs and reached 112% MPHR. The heart rate response was normal. The blood pressure response was normal. No previous exam available for comparison.  CCS 07/2021 IMPRESSION: 1. Coronary calcium score of 2409. This was 99th percentile for age-, race-, and sex-matched controls. 2. Aortic atherosclerosis and aortic valve calcification. 3. Borderline dilated aorta at 37 mm (non-contrast, level of the main PA bifurcation).   EKG:   09/09/2021 EKG - NSR, low voltage  Assessment     ICD-10-CM   1. Coronary artery disease involving coronary bypass graft of native heart without angina pectoris  I25.810     2. High coronary artery calcium score  R93.1     3. Essential hypertension  I10     4. Type 2  diabetes mellitus with complication, without long-term current use of insulin (HCC)  E11.8     5. Stage 3a chronic kidney disease (HCC)  N18.31     6. HYPERCHOLESTEROLEMIA  IIA  E78.00         No orders of the defined types were placed in this encounter.   No orders of the defined types were placed in this encounter.   Medications Discontinued During This Encounter  Medication Reason   albuterol (VENTOLIN HFA) 108 (90 Base) MCG/ACT inhaler    Cetirizine HCl 10 MG CAPS    metFORMIN (GLUCOPHAGE) 500 MG tablet      Recommendations:   [name] is a 76 y.o.  F with significantly elevated CCS, placing her in the 99th percentile risk for her age.  Coronary artery disease involving coronary bypass graft of native heart without angina pectoris High coronary artery calcium score Reviewed results of echo and exercise stress test with patient. She is not experiencing any chest pain or shortness of breath. Will continue with medication management at this time. She is on Aspirin '81mg'$  daily. Will optimize medications for better blood pressure control.  Essential hypertension Blood pressure not well controlled. Goal is >130/80. Will be enrolled in RPM.  Stop Amlodipine. Increase MetoprololXL '100mg'$  daily and torsemide '40mg'$  daily, given BLE edema. Start Spironolactone '25mg'$  daily. Continue other medications as ordered. Continue exercise at least 30 minutes 3-4 times per week.  Type 2 diabetes mellitus with complication, without long-term current use of insulin (HCC) Well controlled on semaglutide, managed by PCP.  Stage 3a chronic kidney disease ([place]) Followed by nephrology. Labs are stable.  HYPERCHOLESTEROLEMIA  IIA She did not tolerated Crestor at '40mg'$  daily due to side effects. She will continue on '20mg'$  daily.  Will check lipid panel and lipoprotein A. We did discuss Repatha and she is open to this medication. Will follow-up after lab work is completed.  Follow-up in 6 weeks  or sooner if needed.    Ernst Spell, NP  10/21/2021, 9:53 AM Office: [phone] Pager: [phone]

## 2021-10-27 ENCOUNTER — Encounter: Payer: Self-pay | Admitting: Internal Medicine

## 2021-10-27 ENCOUNTER — Ambulatory Visit: Payer: Medicare Other | Admitting: Podiatry

## 2021-10-27 ENCOUNTER — Ambulatory Visit: Payer: Medicare Other | Admitting: Internal Medicine

## 2021-10-27 VITALS — BP 158/87 | HR 75 | Temp 97.6°F | Resp 16 | Ht 63.0 in | Wt 217.0 lb

## 2021-10-27 DIAGNOSIS — I10 Essential (primary) hypertension: Secondary | ICD-10-CM | POA: Diagnosis not present

## 2021-10-27 DIAGNOSIS — N1831 Chronic kidney disease, stage 3a: Secondary | ICD-10-CM

## 2021-10-27 DIAGNOSIS — B351 Tinea unguium: Secondary | ICD-10-CM

## 2021-10-27 DIAGNOSIS — E7800 Pure hypercholesterolemia, unspecified: Secondary | ICD-10-CM

## 2021-10-27 DIAGNOSIS — E118 Type 2 diabetes mellitus with unspecified complications: Secondary | ICD-10-CM

## 2021-10-27 DIAGNOSIS — M79675 Pain in left toe(s): Secondary | ICD-10-CM

## 2021-10-27 DIAGNOSIS — M79674 Pain in right toe(s): Secondary | ICD-10-CM

## 2021-10-27 DIAGNOSIS — E0843 Diabetes mellitus due to underlying condition with diabetic autonomic (poly)neuropathy: Secondary | ICD-10-CM

## 2021-10-27 MED ORDER — METOPROLOL SUCCINATE ER 100 MG PO TB24
100.0000 mg | ORAL_TABLET | Freq: Every day | ORAL | 2 refills | Status: DC
Start: 2021-10-27 — End: 2022-04-21

## 2021-10-27 MED ORDER — ROSUVASTATIN CALCIUM 20 MG PO TABS: 20.0000 mg | ORAL_TABLET | Freq: Every day | ORAL | 3 refills | Status: DC

## 2021-10-27 NOTE — Progress Notes (Signed)
Primary Physician/Referring:  Deland Pretty, MD  Patient ID: [name], female    DOB: 1945/10/31, 76 y.o.   MRN: [id number]  Chief Complaint  Patient presents with   Hyperlipidemia   Hypertension   Follow-up   HPI:    [name]  is a 76 y.o. female with past medical history significant for hypertension, diabetes, and obesity. Patient recently had coronary artery calcium scoring which was significantly elevated at 2409, especially in the LAD (1232).  Her score puts her in the 99th percentile for risk in her age group.  She is compliant with all of her medications. She has never had an echocardiogram or stress test in the past and is agreeable to this.  Her blood pressure is well-controlled. She states she has been trying to do some light walking on the treadmill for about 30 minutes.  She is following a healthy diet. She does follow with her family doctor regularly.  She denies chest pain, shortness of breath, palpitations, diaphoresis, syncope, edema, PND, orthopnea.     Past Medical History:  Diagnosis Date   Diabetes mellitus    GERD (gastroesophageal reflux disease)    Glaucoma    Glaucoma    Hypercholesteremia    Hypertension    Obesity    Past Surgical History:  Procedure Laterality Date   ABDOMINAL HYSTERECTOMY     BREAST EXCISIONAL BIOPSY Left    benign   EYE SURGERY     TONSILLECTOMY     Family History  Problem Relation Age of Onset   Heart disease Mother    Cancer Mother    Hypertension Sister    Hypertension Sister    Heart disease Sister    Hypertension Sister    Hypertension Sister    Hypertension Brother    Hypertension Brother    Breast cancer Maternal Aunt    Cancer Other    Hyperlipidemia Other    Hypercalcemia Neg Hx     Social History   Tobacco Use   Smoking status: Never   Smokeless tobacco: Never  Substance Use Topics   Alcohol use: No   Marital Status: Married  ROS  Review of Systems  Constitutional: Negative.  Negative for malaise/fatigue.  Cardiovascular:  Positive for leg swelling. Negative for chest pain, claudication, dyspnea on exertion, near-syncope, orthopnea, palpitations and syncope.  Respiratory: Negative.  Negative for shortness of breath.   Gastrointestinal: Negative.   Neurological:  Negative for dizziness.   Objective  Blood pressure (!) 158/87, pulse 75, temperature 97.6 F (36.4 C), temperature source Temporal, resp. rate 16, height '5\' 3"'$  (1.6 m), weight 217 lb (98.4 kg), SpO2 98 %. Body mass index is 38.44 kg/m.     10/27/2021    1:06 PM 10/27/2021    1:03 PM 10/21/2021    9:42 AM  Vitals with BMI  Height  '5\' 3"'$    Weight  217 lbs   BMI  28.78   Systolic 676 720 947  Diastolic 87 99 89  Pulse 75 79 79     Physical Exam Constitutional:      General: She is not in acute distress.    Appearance: Normal appearance.  HENT:     Head: Normocephalic and atraumatic.  Eyes:     Extraocular Movements: Extraocular movements intact.  Neck:     Vascular: No carotid bruit.  Cardiovascular:     Rate and Rhythm: Normal rate and regular rhythm.     Pulses: Normal pulses.  Heart sounds: Normal heart sounds. No murmur heard.    No friction rub. No gallop.  Pulmonary:     Effort: Pulmonary effort is normal.     Breath sounds: Normal breath sounds.  Abdominal:     General: Bowel sounds are normal.     Palpations: Abdomen is soft.  Musculoskeletal:     Right lower leg: 1+ Edema present.     Left lower leg: 1+ Edema present.  Skin:    General: Skin is warm and dry.  Neurological:     Mental Status: She is alert.   Medications and allergies   Allergies  Allergen Reactions   Methazolamide Rash   Sulfa Antibiotics Other (See Comments)    PATIENT HAS TO MONITOR THE USE OF ANY SULFA PRODUCTS.  IF IT TOUCHES HER SCALP IT CAUSES HER GREAT PAIN AND HEAT.  NOT SURE OF OTHER REACTIONS THAT SHE HAS. Severe head pain with hair products    Acetaminophen    Hydrocodone     Hydrocodone-Acetaminophen     Out of body experience   Sulfonamide Derivatives     Severe head pain with hair products   Latex Rash     Medication list after today's encounter   Current Outpatient Medications:    acetaminophen (TYLENOL 8 HOUR) 650 MG CR tablet, 1 tablet, Disp: , Rfl:    AgaMatrix Ultra-Thin Lancets MISC, USE   TO CHECK GLUCOSE TWICE DAILY AS DIRECTED, Disp: , Rfl:    aspirin EC 81 MG tablet, Take 81 mg by mouth daily., Disp: , Rfl:    brimonidine-timolol (COMBIGAN) 0.2-0.5 % ophthalmic solution, Place 1 drop into both eyes every 12 (twelve) hours., Disp: , Rfl:    dapagliflozin propanediol (FARXIGA) 10 MG TABS tablet, 1 tablet, Disp: , Rfl:    esomeprazole (NEXIUM) 40 MG capsule, Take 40 mg by mouth daily as needed. Acid reflux, Disp: , Rfl:    glucose blood (ONETOUCH VERIO) test strip, USE 1 STRIP TO CHECK GLUCOSE TWICE DAILY, Disp: , Rfl:    glucose blood (ONETOUCH VERIO) test strip, USE 1 STRIP TO CHECK GLUCOSE TWICE DAILY AS DIRECTED, Disp: , Rfl:    irbesartan (AVAPRO) 300 MG tablet, Take 1 tablet by mouth daily., Disp: , Rfl:    latanoprost (XALATAN) 0.005 % ophthalmic solution, 1 drop into each eye in the evening, Disp: , Rfl:    metFORMIN (GLUCOPHAGE-XR) 500 MG 24 hr tablet, Take 1 tablet by mouth 2 (two) times daily., Disp: , Rfl:    pregabalin (LYRICA) 50 MG capsule, Take 50 mg by mouth at bedtime., Disp: , Rfl:    Semaglutide, 1 MG/DOSE, (OZEMPIC, 1 MG/DOSE,) 4 MG/3ML SOPN, 1 mg, Disp: , Rfl:    spironolactone (ALDACTONE) 25 MG tablet, Take 1 tablet (25 mg total) by mouth daily., Disp: 90 tablet, Rfl: 3   torsemide 40 MG TABS, Take 20 mg by mouth daily., Disp: 90 tablet, Rfl: 3   metoprolol succinate (TOPROL-XL) 100 MG 24 hr tablet, Take 1 tablet (100 mg total) by mouth at bedtime. Take with or immediately following a meal., Disp: 90 tablet, Rfl: 2   rosuvastatin (CRESTOR) 20 MG tablet, Take 1 tablet (20 mg total) by mouth at bedtime., Disp: 90 tablet, Rfl:  3  Laboratory examination:   Lab Results  Component Value Date   NA 140 04/02/2020   K 3.7 04/02/2020   CO2 28 04/02/2020   GLUCOSE 172 (H) 04/02/2020   BUN 24 (H) 04/02/2020   CREATININE 1.42 (H) 04/02/2020  CALCIUM 10.2 04/02/2020   CALCIUM 10.2 04/02/2020   GFRNONAA 42 (L) 03/08/2015       Latest Ref Rng & Units 04/02/2020    3:24 PM 03/08/2015    9:55 AM 06/01/2013    7:40 PM  CMP  Glucose 70 - 99 mg/dL 172  291  221   BUN 6 - 23 mg/dL '24  30  19   '$ Creatinine 0.40 - 1.20 mg/dL 1.42  1.28  1.00   Sodium 135 - 145 mEq/L 140  139  139   Potassium 3.5 - 5.1 mEq/L 3.7  4.6  4.3   Chloride 96 - 112 mEq/L 103  105  102   CO2 19 - 32 mEq/L '28  26  23   '$ Calcium 8.4 - 10.5 mg/dL 8.6 - 10.4 mg/dL 10.2    10.2  9.9  10.3   Total Protein 6.0 - 8.3 g/dL   7.7   Total Bilirubin 0.3 - 1.2 mg/dL   0.3   Alkaline Phos 39 - 117 U/L   107   AST 0 - 37 U/L   19   ALT 0 - 35 U/L   17       Latest Ref Rng & Units 03/08/2015    9:55 AM 06/01/2013    7:40 PM 04/02/2013   11:15 AM  CBC  WBC 4.0 - 10.5 K/uL 5.1  6.5  6.5   Hemoglobin 12.0 - 15.0 g/dL 12.8  12.4  13.3   Hematocrit 36.0 - 46.0 % 39.8  37.3  39.4   Platelets 150 - 400 K/uL 191  230  205     Lipid Panel No results for input(s): "CHOL", "TRIG", "[place]", "VLDL", "HDL", "CHOLHDL", "LDLDIRECT" in the last 8760 hours.  HEMOGLOBIN A1C Lab Results  Component Value Date   HGBA1C (H) 05/09/2008    6.5 (NOTE) The ADA recommends the following therapeutic goal for glycemic control related to Hgb A1c measurement: Goal of therapy: <6.5 Hgb A1c  Reference: American Diabetes Association: Clinical Practice Recommendations 2010, Diabetes Care, 2010, 33: (Suppl  1).   MPG 140 05/09/2008   TSH No results for input(s): "TSH" in the last 8760 hours.  External labs:   02/24/2021 Cholesterol 145  Triglycerides 110  HDL 56  LDL 67  Hgb A1c 6.9%  BUN 26 Creatinine 1.28  Estimated GFR 48  Radiology:    Cardiac Studies:   Echocardiogram 10/05/2021: Normal LV systolic function with visual EF 55-60%. Left ventricle cavity is normal in size. Moderate concentric hypertrophy of the left ventricle. Normal global wall motion. Doppler evidence of grade I (impaired) diastolic dysfunction, normal LAP. Calculated EF 57%. Left atrial cavity is mildly dilated. Trileaflet aortic valve.  Trace aortic regurgitation. Mild aortic valve leaflet calcification. Structurally normal mitral valve.  Mild (Grade I) mitral regurgitation. Structurally normal tricuspid valve.  Mild tricuspid regurgitation. No evidence of pulmonary hypertension. No prior available for comparison.   Exercise nuclear stress test 09/29/21 Myocardial perfusion is normal. Low risk study. Overall LV systolic function is normal without regional wall motion abnormalities. Stress LV EF: 55%.  Normal ECG stress. The patient exercised for 4 minutes and 29 seconds of a Bruce protocol, achieving approximately 6.44 METs and reached 112% MPHR. The heart rate response was normal. The blood pressure response was normal. No previous exam available for comparison.  CCS 07/2021 IMPRESSION: 1. Coronary calcium score of 2409. This was 99th percentile for age-, race-, and sex-matched controls. 2. Aortic atherosclerosis and aortic valve calcification. 3.  Borderline dilated aorta at 37 mm (non-contrast, level of the main PA bifurcation).   EKG:   09/09/2021 EKG - NSR, low voltage  Assessment     ICD-10-CM   1. Essential hypertension  I10     2. Type 2 diabetes mellitus with complication, without long-term current use of insulin (HCC)  E11.8     3. HYPERCHOLESTEROLEMIA  IIA  E78.00         No orders of the defined types were placed in this encounter.   Meds ordered this encounter  Medications   metoprolol succinate (TOPROL-XL) 100 MG 24 hr tablet    Sig: Take 1 tablet (100 mg total) by mouth at bedtime. Take with or immediately following a meal.    Dispense:  90  tablet    Refill:  2   rosuvastatin (CRESTOR) 20 MG tablet    Sig: Take 1 tablet (20 mg total) by mouth at bedtime.    Dispense:  90 tablet    Refill:  3    Medications Discontinued During This Encounter  Medication Reason   metoprolol succinate (TOPROL-XL) 100 MG 24 hr tablet    rosuvastatin (CRESTOR) 40 MG tablet      Recommendations:   ERYN MARANDOLA is a 76 y.o.  F with significantly elevated CCS, placing her in the 99th percentile risk for her age.  Coronary artery disease involving coronary bypass graft of native heart without angina pectoris High coronary artery calcium score Reviewed results of echo and exercise stress test with patient. She is not experiencing any chest pain or shortness of breath. Will continue with medication management at this time. She is on Aspirin '81mg'$  daily. Will optimize medications for better blood pressure control.  Essential hypertension Blood pressure not well controlled. Goal is >130/80. Will be enrolled in RPM.  Continue MetoprololXL '100mg'$  daily and torsemide '40mg'$  daily, given BLE edema. Start Spironolactone '25mg'$  daily. Continue other medications as ordered. Continue exercise at least 30 minutes 3-4 times per week.  Type 2 diabetes mellitus with complication, without long-term current use of insulin (HCC) Well controlled on semaglutide, managed by PCP.  Stage 3a chronic kidney disease ([place]) Followed by nephrology. Labs are stable.  HYPERCHOLESTEROLEMIA  IIA She did not tolerated Crestor at '40mg'$  daily due to side effects. She will continue on '20mg'$  daily. New script sent Will check lipid panel and lipoprotein A. We did discuss Repatha and she is open to this medication. Will follow-up after lab work is completed.  Follow-up in 6 months or sooner if needed.    Floydene Flock, DO  10/27/2021, 1:39 PM Office: [phone] Pager: [phone]

## 2021-10-27 NOTE — Progress Notes (Addendum)
This patient returns to my office for at risk foot care.  This patient requires this care by a professional since this patient will be at risk due to having dabetes and CKD.   This patient is unable to cut nails herself since the patient cannot reach her nails.These nails are painful walking and wearing shoes.  This patient presents for at risk foot care today.  General Appearance  Alert, conversant and in no acute stress.  Vascular  Dorsalis pedis  pulses are palpable  bilaterally. Posterior tibial pulses are absent  bilaterally. Capillary return is within normal limits  bilaterally. Temperature is within normal limits  bilaterally.  Neurologic  Senn-Weinstein monofilament wire test within normal limits  bilaterally. Muscle power within normal limits bilaterally.  Nails Thick disfigured discolored nails with subungual debris  from hallux to fifth toes bilaterally. No evidence of bacterial infection or drainage bilaterally.  Orthopedic  No limitations of motion  feet .  No crepitus or effusions noted.  No bony pathology or digital deformities noted.  HAV  B/L.  Peroneal spastic flaftoot left.    Skin  normotropic skin with no porokeratosis noted bilaterally.  No signs of infections or ulcers noted.     Onychomycosis  Pain in right toes  Pain in left toes  Consent was obtained for treatment procedures.   Mechanical debridement of nails 1-5  bilaterally performed with a nail nipper.  Filed with dremel without incident.    Return office visit   3  months                   Told patient to return for periodic foot care and evaluation due to potential at risk complications.   Evah Rashid DPM   

## 2021-10-28 ENCOUNTER — Telehealth: Payer: Self-pay

## 2021-10-28 ENCOUNTER — Other Ambulatory Visit: Payer: Self-pay

## 2021-10-28 MED ORDER — TORSEMIDE 40 MG PO TABS: 20.0000 mg | ORAL_TABLET | Freq: Every day | ORAL | 3 refills | Status: DC

## 2021-10-28 NOTE — Telephone Encounter (Signed)
Spoke with patient for RPM 1 week follow-up. Patient states she is doing well. No concerns or issues to report at this time. Will continue to monitor.   BP Readings: 10/27/2021 Wednesday at 07:39 AM 131 / 86      10/26/2021 Tuesday at 09:58 AM 132 / 78      10/25/2021 Monday at 07:55 AM 148 / 99      10/24/2021 'Sunday at 09:33 PM 145 / 88      10/23/2021 Saturday at 10:07 AM 144 / 96      10/22/2021 Friday at 01:20 PM 140 / 92      09'$ /21/2023 Thursday at 10:34 AM 149 / 105

## 2021-11-05 DIAGNOSIS — I10 Essential (primary) hypertension: Secondary | ICD-10-CM | POA: Diagnosis not present

## 2021-12-06 DIAGNOSIS — I10 Essential (primary) hypertension: Secondary | ICD-10-CM | POA: Diagnosis not present

## 2021-12-09 ENCOUNTER — Ambulatory Visit: Payer: Medicare Other | Admitting: Internal Medicine

## 2021-12-09 ENCOUNTER — Encounter: Payer: Self-pay | Admitting: Internal Medicine

## 2021-12-09 VITALS — BP 160/91 | HR 79 | Ht 63.0 in | Wt 213.0 lb

## 2021-12-09 DIAGNOSIS — I10 Essential (primary) hypertension: Secondary | ICD-10-CM

## 2021-12-09 DIAGNOSIS — I25810 Atherosclerosis of coronary artery bypass graft(s) without angina pectoris: Secondary | ICD-10-CM | POA: Diagnosis not present

## 2021-12-09 DIAGNOSIS — E782 Mixed hyperlipidemia: Secondary | ICD-10-CM | POA: Diagnosis not present

## 2021-12-09 DIAGNOSIS — E559 Vitamin D deficiency, unspecified: Secondary | ICD-10-CM | POA: Diagnosis not present

## 2021-12-09 DIAGNOSIS — E118 Type 2 diabetes mellitus with unspecified complications: Secondary | ICD-10-CM | POA: Diagnosis not present

## 2021-12-09 DIAGNOSIS — E1121 Type 2 diabetes mellitus with diabetic nephropathy: Secondary | ICD-10-CM | POA: Diagnosis not present

## 2021-12-09 DIAGNOSIS — Z7902 Long term (current) use of antithrombotics/antiplatelets: Secondary | ICD-10-CM | POA: Diagnosis not present

## 2021-12-09 NOTE — Progress Notes (Signed)
Primary Physician/Referring:  Deland Pretty, MD  Patient ID: [name], female    DOB: [birth year], 76 y.o.   MRN: [id number]  Chief Complaint  Patient presents with   Hypertension   Follow-up   HPI:    [name]  is a 76 y.o. female with past medical history significant for hypertension, diabetes, and obesity. Patient is here for a follow-up visit. She recently went on a cruise and did not adhere to her cardiac diet and missed some of her BP meds which is why her pressure is elevated today. Generally, patient is compliant and takes her medications as prescribed.  She denies chest pain, shortness of breath, palpitations, diaphoresis, syncope, edema, PND, orthopnea.    Past Medical History:  Diagnosis Date   Diabetes mellitus    GERD (gastroesophageal reflux disease)    Glaucoma    Glaucoma    Hypercholesteremia    Hypertension    Obesity    Past Surgical History:  Procedure Laterality Date   ABDOMINAL HYSTERECTOMY     BREAST EXCISIONAL BIOPSY Left    benign   EYE SURGERY     TONSILLECTOMY     Family History  Problem Relation Age of Onset   Heart disease Mother    Cancer Mother    Hypertension Sister    Hypertension Sister    Heart disease Sister    Hypertension Sister    Hypertension Sister    Hypertension Brother    Hypertension Brother    Breast cancer Maternal Aunt    Cancer Other    Hyperlipidemia Other    Hypercalcemia Neg Hx     Social History   Tobacco Use   Smoking status: Never   Smokeless tobacco: Never  Substance Use Topics   Alcohol use: No   Marital Status: Married  ROS  Review of Systems  Constitutional: Negative. Negative for malaise/fatigue.  Cardiovascular:  Positive for leg swelling. Negative for chest pain, claudication, dyspnea on exertion, near-syncope, orthopnea, palpitations and syncope.  Respiratory: Negative.  Negative for shortness of breath.   Gastrointestinal: Negative.   Neurological:  Negative for dizziness.    Objective  Blood pressure (!) 160/91, pulse 79, height '5\' 3"'$  (1.6 m), weight 213 lb (96.6 kg), SpO2 96 %. Body mass index is 37.73 kg/m.     12/09/2021    9:45 AM 12/09/2021    9:41 AM 10/27/2021    1:06 PM  Vitals with BMI  Height  '5\' 3"'$    Weight  213 lbs   BMI  73.42   Systolic 876 811 572  Diastolic 91 97 87  Pulse 79 77 75     Physical Exam Constitutional:      General: She is not in acute distress.    Appearance: Normal appearance.  HENT:     Head: Normocephalic and atraumatic.  Eyes:     Extraocular Movements: Extraocular movements intact.  Neck:     Vascular: No carotid bruit.  Cardiovascular:     Rate and Rhythm: Normal rate and regular rhythm.     Pulses: Normal pulses.     Heart sounds: Normal heart sounds. No murmur heard.    No friction rub. No gallop.  Pulmonary:     Effort: Pulmonary effort is normal.     Breath sounds: Normal breath sounds.  Abdominal:     General: Bowel sounds are normal.     Palpations: Abdomen is soft.  Musculoskeletal:     Right lower leg: 1+ Edema  present.     Left lower leg: 1+ Edema present.  Skin:    General: Skin is warm and dry.  Neurological:     Mental Status: She is alert.    Medications and allergies   Allergies  Allergen Reactions   Methazolamide Rash   Sulfa Antibiotics Other (See Comments)    PATIENT HAS TO MONITOR THE USE OF ANY SULFA PRODUCTS.  IF IT TOUCHES HER SCALP IT CAUSES HER GREAT PAIN AND HEAT.  NOT SURE OF OTHER REACTIONS THAT SHE HAS. Severe head pain with hair products    Acetaminophen    Hydrocodone    Hydrocodone-Acetaminophen     Out of body experience   Sulfonamide Derivatives     Severe head pain with hair products   Latex Rash     Medication list after today's encounter   Current Outpatient Medications:    acetaminophen (TYLENOL 8 HOUR) 650 MG CR tablet, 1 tablet, Disp: , Rfl:    AgaMatrix Ultra-Thin Lancets MISC, USE   TO CHECK GLUCOSE TWICE DAILY AS DIRECTED, Disp: , Rfl:     aspirin EC 81 MG tablet, Take 81 mg by mouth daily., Disp: , Rfl:    brimonidine-timolol (COMBIGAN) 0.2-0.5 % ophthalmic solution, Place 1 drop into both eyes every 12 (twelve) hours., Disp: , Rfl:    dapagliflozin propanediol (FARXIGA) 10 MG TABS tablet, 1 tablet, Disp: , Rfl:    esomeprazole (NEXIUM) 40 MG capsule, Take 40 mg by mouth daily as needed. Acid reflux, Disp: , Rfl:    glucose blood (ONETOUCH VERIO) test strip, USE 1 STRIP TO CHECK GLUCOSE TWICE DAILY, Disp: , Rfl:    glucose blood (ONETOUCH VERIO) test strip, USE 1 STRIP TO CHECK GLUCOSE TWICE DAILY AS DIRECTED, Disp: , Rfl:    irbesartan (AVAPRO) 300 MG tablet, Take 1 tablet by mouth daily., Disp: , Rfl:    latanoprost (XALATAN) 0.005 % ophthalmic solution, 1 drop into each eye in the evening, Disp: , Rfl:    metFORMIN (GLUCOPHAGE-XR) 500 MG 24 hr tablet, Take 1 tablet by mouth 2 (two) times daily., Disp: , Rfl:    metoprolol succinate (TOPROL-XL) 100 MG 24 hr tablet, Take 1 tablet (100 mg total) by mouth at bedtime. Take with or immediately following a meal., Disp: 90 tablet, Rfl: 2   pregabalin (LYRICA) 50 MG capsule, Take 50 mg by mouth at bedtime., Disp: , Rfl:    rosuvastatin (CRESTOR) 20 MG tablet, Take 1 tablet (20 mg total) by mouth at bedtime., Disp: 90 tablet, Rfl: 3   Semaglutide, 1 MG/DOSE, (OZEMPIC, 1 MG/DOSE,) 4 MG/3ML SOPN, 1 mg, Disp: , Rfl:    spironolactone (ALDACTONE) 25 MG tablet, Take 1 tablet (25 mg total) by mouth daily., Disp: 90 tablet, Rfl: 3   Torsemide 40 MG TABS, Take 20 mg by mouth daily., Disp: 90 tablet, Rfl: 3  Laboratory examination:   Lab Results  Component Value Date   NA 140 04/02/2020   K 3.7 04/02/2020   CO2 28 04/02/2020   GLUCOSE 172 (H) 04/02/2020   BUN 24 (H) 04/02/2020   CREATININE 1.42 (H) 04/02/2020   CALCIUM 10.2 04/02/2020   CALCIUM 10.2 04/02/2020   GFRNONAA 42 (L) 03/08/2015       Latest Ref Rng & Units 04/02/2020    3:24 PM 03/08/2015    9:55 AM 06/01/2013    7:40 PM  CMP   Glucose 70 - 99 mg/dL 172  291  221   BUN 6 - 23 mg/dL  $'24  30  19   'q$ Creatinine 0.40 - 1.20 mg/dL 1.42  1.28  1.00   Sodium 135 - 145 mEq/L 140  139  139   Potassium 3.5 - 5.1 mEq/L 3.7  4.6  4.3   Chloride 96 - 112 mEq/L 103  105  102   CO2 19 - 32 mEq/L '28  26  23   '$ Calcium 8.4 - 10.5 mg/dL 8.6 - 10.4 mg/dL 10.2    10.2  9.9  10.3   Total Protein 6.0 - 8.3 g/dL   7.7   Total Bilirubin 0.3 - 1.2 mg/dL   0.3   Alkaline Phos 39 - 117 U/L   107   AST 0 - 37 U/L   19   ALT 0 - 35 U/L   17       Latest Ref Rng & Units 03/08/2015    9:55 AM 06/01/2013    7:40 PM 04/02/2013   11:15 AM  CBC  WBC 4.0 - 10.5 K/uL 5.1  6.5  6.5   Hemoglobin 12.0 - 15.0 g/dL 12.8  12.4  13.3   Hematocrit 36.0 - 46.0 % 39.8  37.3  39.4   Platelets 150 - 400 K/uL 191  230  205     Lipid Panel No results for input(s): "CHOL", "TRIG", "[place]", "VLDL", "HDL", "CHOLHDL", "LDLDIRECT" in the last 8760 hours.  HEMOGLOBIN A1C Lab Results  Component Value Date   HGBA1C (H) 05/09/2008    6.5 (NOTE) The ADA recommends the following therapeutic goal for glycemic control related to Hgb A1c measurement: Goal of therapy: <6.5 Hgb A1c  Reference: American Diabetes Association: Clinical Practice Recommendations 2010, Diabetes Care, 2010, 33: (Suppl  1).   MPG 140 05/09/2008   TSH No results for input(s): "TSH" in the last 8760 hours.  External labs:   02/24/2021 Cholesterol 145  Triglycerides 110  HDL 56  LDL 67  Hgb A1c 6.9%  BUN 26 Creatinine 1.28  Estimated GFR 48  Radiology:    Cardiac Studies:  Echocardiogram 10/05/2021: Normal LV systolic function with visual EF 55-60%. Left ventricle cavity is normal in size. Moderate concentric hypertrophy of the left ventricle. Normal global wall motion. Doppler evidence of grade I (impaired) diastolic dysfunction, normal LAP. Calculated EF 57%. Left atrial cavity is mildly dilated. Trileaflet aortic valve.  Trace aortic regurgitation. Mild aortic valve leaflet  calcification. Structurally normal mitral valve.  Mild (Grade I) mitral regurgitation. Structurally normal tricuspid valve.  Mild tricuspid regurgitation. No evidence of pulmonary hypertension. No prior available for comparison.   Exercise nuclear stress test 09/29/21 Myocardial perfusion is normal. Low risk study. Overall LV systolic function is normal without regional wall motion abnormalities. Stress LV EF: 55%.  Normal ECG stress. The patient exercised for 4 minutes and 29 seconds of a Bruce protocol, achieving approximately 6.44 METs and reached 112% MPHR. The heart rate response was normal. The blood pressure response was normal. No previous exam available for comparison.  CCS 07/2021 IMPRESSION: 1. Coronary calcium score of 2409. This was 99th percentile for age-, race-, and sex-matched controls. 2. Aortic atherosclerosis and aortic valve calcification. 3. Borderline dilated aorta at 37 mm (non-contrast, level of the main PA bifurcation).   EKG:   09/09/2021 EKG - NSR, low voltage  Assessment     ICD-10-CM   1. Coronary artery disease involving coronary bypass graft of native heart without angina pectoris  I25.810     2. Essential hypertension  I10     3.  Type 2 diabetes mellitus with complication, without long-term current use of insulin (HCC)  E11.8         No orders of the defined types were placed in this encounter.   No orders of the defined types were placed in this encounter.   There are no discontinued medications.    Recommendations:   [name] is a 76 y.o.  F with significantly elevated CCS, placing her in the 99th percentile risk for her age.  Coronary artery disease involving coronary bypass graft of native heart without angina pectoris High coronary artery calcium score Reviewed results of echo and exercise stress test with patient. She is not experiencing any chest pain or shortness of breath. Will continue with medication management at this  time. She is on Aspirin '81mg'$  daily. Will optimize medications for better blood pressure control.  Essential hypertension Blood pressure not well controlled. Goal is <130/80.  Continue Irbesartan, MetoprololXL '100mg'$  daily, torsemide '40mg'$  daily, Spironolactone '25mg'$  daily. Continue other medications as ordered. Continue exercise at least 30 minutes 3-4 times per week.  Type 2 diabetes mellitus with complication, without long-term current use of insulin (HCC) Well controlled on semaglutide, managed by PCP.  Stage 3a chronic kidney disease ([place]) Followed by nephrology. Labs are stable.  HYPERCHOLESTEROLEMIA  IIA  continue on '20mg'$  daily. New script sent   Follow-up in 6 months or sooner if needed.    Caitlin Flock, DO  12/09/2021, 10:48 AM Office: [phone] Pager: [phone]

## 2021-12-13 DIAGNOSIS — E876 Hypokalemia: Secondary | ICD-10-CM | POA: Diagnosis not present

## 2021-12-14 ENCOUNTER — Telehealth: Payer: Self-pay

## 2021-12-14 NOTE — Telephone Encounter (Signed)
Spoke with patient today for RPM check-in.Patient went to see PCP and completed lab work. PCP called patient due to potassium levels being low and sent potassium rx for 3 days. Patient has already repeated labs and is awaiting results from PCP. She will give me a call once she receives lab work. Will continue to monitor and follow up with patient.  BP Readings: 12/14/2021 Tuesday at 07:51 AM 149 / 100      12/12/2021 'Sunday at 08:51 AM 131 / 95      12/10/2021 Friday at 06:09 PM 128 / 87      12/09/2021 Thursday at 07:37 PM 129 / 80      12/02/2021 Thursday at 09:01 PM 144 / 97      12/01/2021 Wednesday at 08:04 AM 142 / 95  Pulse Readings: 12/14/2021 Tuesday at 07:51 AM 77      12/12/2021 Sunday at 08:51 AM 83      12/10/2021 Friday at 06:09 PM 85      12/09/2021 Thursday at 07:37 PM 79      12/02/2021 Thursday at 09:01 PM 90      11'$ /01/2021 Wednesday at 08:04 AM 73

## 2021-12-16 DIAGNOSIS — E782 Mixed hyperlipidemia: Secondary | ICD-10-CM | POA: Diagnosis not present

## 2021-12-16 DIAGNOSIS — E559 Vitamin D deficiency, unspecified: Secondary | ICD-10-CM | POA: Diagnosis not present

## 2021-12-16 DIAGNOSIS — I2510 Atherosclerotic heart disease of native coronary artery without angina pectoris: Secondary | ICD-10-CM | POA: Diagnosis not present

## 2021-12-16 DIAGNOSIS — I700 Atherosclerosis of aorta: Secondary | ICD-10-CM | POA: Diagnosis not present

## 2021-12-16 DIAGNOSIS — E1121 Type 2 diabetes mellitus with diabetic nephropathy: Secondary | ICD-10-CM | POA: Diagnosis not present

## 2021-12-16 DIAGNOSIS — Z7902 Long term (current) use of antithrombotics/antiplatelets: Secondary | ICD-10-CM | POA: Diagnosis not present

## 2021-12-16 DIAGNOSIS — I10 Essential (primary) hypertension: Secondary | ICD-10-CM | POA: Diagnosis not present

## 2021-12-20 DIAGNOSIS — Z961 Presence of intraocular lens: Secondary | ICD-10-CM | POA: Diagnosis not present

## 2021-12-20 DIAGNOSIS — H401132 Primary open-angle glaucoma, bilateral, moderate stage: Secondary | ICD-10-CM | POA: Diagnosis not present

## 2021-12-20 DIAGNOSIS — E119 Type 2 diabetes mellitus without complications: Secondary | ICD-10-CM | POA: Diagnosis not present

## 2021-12-22 DIAGNOSIS — E876 Hypokalemia: Secondary | ICD-10-CM | POA: Diagnosis not present

## 2021-12-30 DIAGNOSIS — I700 Atherosclerosis of aorta: Secondary | ICD-10-CM | POA: Diagnosis not present

## 2021-12-30 DIAGNOSIS — Z7902 Long term (current) use of antithrombotics/antiplatelets: Secondary | ICD-10-CM | POA: Diagnosis not present

## 2021-12-30 DIAGNOSIS — E1121 Type 2 diabetes mellitus with diabetic nephropathy: Secondary | ICD-10-CM | POA: Diagnosis not present

## 2021-12-30 DIAGNOSIS — E559 Vitamin D deficiency, unspecified: Secondary | ICD-10-CM | POA: Diagnosis not present

## 2021-12-30 DIAGNOSIS — E782 Mixed hyperlipidemia: Secondary | ICD-10-CM | POA: Diagnosis not present

## 2021-12-30 DIAGNOSIS — I2510 Atherosclerotic heart disease of native coronary artery without angina pectoris: Secondary | ICD-10-CM | POA: Diagnosis not present

## 2021-12-30 DIAGNOSIS — I10 Essential (primary) hypertension: Secondary | ICD-10-CM | POA: Diagnosis not present

## 2022-01-05 DIAGNOSIS — I10 Essential (primary) hypertension: Secondary | ICD-10-CM | POA: Diagnosis not present

## 2022-01-07 ENCOUNTER — Ambulatory Visit: Payer: Medicare Other | Admitting: Internal Medicine

## 2022-01-07 ENCOUNTER — Encounter: Payer: Self-pay | Admitting: Internal Medicine

## 2022-01-07 VITALS — BP 146/82 | HR 82 | Ht 63.0 in | Wt 206.2 lb

## 2022-01-07 DIAGNOSIS — E7800 Pure hypercholesterolemia, unspecified: Secondary | ICD-10-CM

## 2022-01-07 DIAGNOSIS — N1831 Chronic kidney disease, stage 3a: Secondary | ICD-10-CM | POA: Diagnosis not present

## 2022-01-07 DIAGNOSIS — I10 Essential (primary) hypertension: Secondary | ICD-10-CM

## 2022-01-07 DIAGNOSIS — I25810 Atherosclerosis of coronary artery bypass graft(s) without angina pectoris: Secondary | ICD-10-CM | POA: Diagnosis not present

## 2022-01-07 MED ORDER — AMLODIPINE BESYLATE 5 MG PO TABS: 5.0000 mg | ORAL_TABLET | Freq: Every day | ORAL | 3 refills | Status: DC

## 2022-01-07 MED ORDER — TORSEMIDE 20 MG PO TABS: 20.0000 mg | ORAL_TABLET | Freq: Every day | ORAL | 3 refills | Status: DC

## 2022-01-07 NOTE — Progress Notes (Signed)
Primary Physician/Referring:  Deland Pretty, MD  Patient ID: [name], female    DOB: May 13, 1945, 76 y.o.   MRN: [id number]  Chief Complaint  Patient presents with  . Coronary Artery Disease  . Follow-up  . Results  . Medication Management   HPI:    [name]  is a 76 y.o. female with past medical history significant for hypertension, diabetes, and obesity. Patient is here for a follow-up visit. She recently went on a cruise and did not adhere to her cardiac diet and missed some of her BP meds which is why her pressure is elevated today. Generally, patient is compliant and takes her medications as prescribed.  She denies chest pain, shortness of breath, palpitations, diaphoresis, syncope, edema, PND, orthopnea.    Past Medical History:  Diagnosis Date  . Diabetes mellitus   . GERD (gastroesophageal reflux disease)   . Glaucoma   . Glaucoma   . Hypercholesteremia   . Hypertension   . Obesity    Past Surgical History:  Procedure Laterality Date  . ABDOMINAL HYSTERECTOMY    . BREAST EXCISIONAL BIOPSY Left    benign  . EYE SURGERY    . TONSILLECTOMY     Family History  Problem Relation Age of Onset  . Heart disease Mother   . Cancer Mother   . Hypertension Sister   . Hypertension Sister   . Heart disease Sister   . Hypertension Sister   . Hypertension Sister   . Hypertension Brother   . Hypertension Brother   . Breast cancer Maternal Aunt   . Cancer Other   . Hyperlipidemia Other   . Hypercalcemia Neg Hx     Social History   Tobacco Use  . Smoking status: Never  . Smokeless tobacco: Never  Substance Use Topics  . Alcohol use: No   Marital Status: Married  ROS  Review of Systems  Constitutional: Negative. Negative for malaise/fatigue.  Cardiovascular:  Positive for leg swelling. Negative for chest pain, claudication, dyspnea on exertion, near-syncope, orthopnea, palpitations and syncope.  Respiratory: Negative.  Negative for shortness of  breath.   Gastrointestinal: Negative.   Neurological:  Negative for dizziness.   Objective  Blood pressure (!) 146/82, pulse 82, height '5\' 3"'$  (1.6 m), weight 206 lb 3.2 oz (93.5 kg), SpO2 99 %. Body mass index is 36.53 kg/m.     01/07/2022    9:20 AM 12/09/2021    9:45 AM 12/09/2021    9:41 AM  Vitals with BMI  Height '5\' 3"'$   '5\' 3"'$   Weight 206 lbs 3 oz  213 lbs  BMI 21.30  86.57  Systolic 846 962 952  Diastolic 82 91 97  Pulse 82 79 77     Physical Exam Constitutional:      General: She is not in acute distress.    Appearance: Normal appearance.  HENT:     Head: Normocephalic and atraumatic.  Eyes:     Extraocular Movements: Extraocular movements intact.  Neck:     Vascular: No carotid bruit.  Cardiovascular:     Rate and Rhythm: Normal rate and regular rhythm.     Pulses: Normal pulses.     Heart sounds: Normal heart sounds. No murmur heard.    No friction rub. No gallop.  Pulmonary:     Effort: Pulmonary effort is normal.     Breath sounds: Normal breath sounds.  Abdominal:     General: Bowel sounds are normal.  Palpations: Abdomen is soft.  Musculoskeletal:     Right lower leg: 1+ Edema present.     Left lower leg: 1+ Edema present.  Skin:    General: Skin is warm and dry.  Neurological:     Mental Status: She is alert.   Medications and allergies   Allergies  Allergen Reactions  . Methazolamide Rash  . Sulfa Antibiotics Other (See Comments)    PATIENT HAS TO MONITOR THE USE OF ANY SULFA PRODUCTS.  IF IT TOUCHES HER SCALP IT CAUSES HER GREAT PAIN AND HEAT.  NOT SURE OF OTHER REACTIONS THAT SHE HAS. Severe head pain with hair products   . Acetaminophen   . Hydrocodone   . Hydrocodone-Acetaminophen     Out of body experience  . Sulfonamide Derivatives     Severe head pain with hair products  . Latex Rash     Medication list after today's encounter   Current Outpatient Medications:  .  acetaminophen (TYLENOL 8 HOUR) 650 MG CR tablet, 1 tablet,  Disp: , Rfl:  .  AgaMatrix Ultra-Thin Lancets MISC, USE   TO CHECK GLUCOSE TWICE DAILY AS DIRECTED, Disp: , Rfl:  .  amLODipine (NORVASC) 10 MG tablet, Take 10 mg by mouth daily., Disp: , Rfl:  .  aspirin EC 81 MG tablet, Take 81 mg by mouth daily., Disp: , Rfl:  .  brimonidine-timolol (COMBIGAN) 0.2-0.5 % ophthalmic solution, Place 1 drop into both eyes every 12 (twelve) hours., Disp: , Rfl:  .  dapagliflozin propanediol (FARXIGA) 10 MG TABS tablet, 1 tablet, Disp: , Rfl:  .  esomeprazole (NEXIUM) 40 MG capsule, Take 40 mg by mouth daily as needed. Acid reflux, Disp: , Rfl:  .  irbesartan (AVAPRO) 300 MG tablet, Take 1 tablet by mouth daily., Disp: , Rfl:  .  latanoprost (XALATAN) 0.005 % ophthalmic solution, 1 drop into each eye in the evening, Disp: , Rfl:  .  metFORMIN (GLUCOPHAGE-XR) 500 MG 24 hr tablet, Take 1 tablet by mouth 2 (two) times daily., Disp: , Rfl:  .  metoprolol succinate (TOPROL-XL) 100 MG 24 hr tablet, Take 1 tablet (100 mg total) by mouth at bedtime. Take with or immediately following a meal. (Patient taking differently: Take 50 mg by mouth at bedtime. Take with or immediately following a meal.), Disp: 90 tablet, Rfl: 2 .  potassium chloride SA (KLOR-CON M) 20 MEQ tablet, Take 20 mEq by mouth 2 (two) times daily., Disp: , Rfl:  .  pregabalin (LYRICA) 50 MG capsule, Take 50 mg by mouth at bedtime., Disp: , Rfl:  .  rosuvastatin (CRESTOR) 40 MG tablet, Take 20 mg by mouth daily., Disp: , Rfl:  .  Semaglutide, 1 MG/DOSE, (OZEMPIC, 1 MG/DOSE,) 4 MG/3ML SOPN, 1 mg, Disp: , Rfl:  .  spironolactone (ALDACTONE) 25 MG tablet, Take 1 tablet (25 mg total) by mouth daily., Disp: 90 tablet, Rfl: 3 .  Torsemide 40 MG TABS, Take 20 mg by mouth daily., Disp: 90 tablet, Rfl: 3 .  glucose blood (ONETOUCH VERIO) test strip, USE 1 STRIP TO CHECK GLUCOSE TWICE DAILY, Disp: , Rfl:  .  glucose blood (ONETOUCH VERIO) test strip, USE 1 STRIP TO CHECK GLUCOSE TWICE DAILY AS DIRECTED, Disp: , Rfl:    Laboratory examination:   Lab Results  Component Value Date   NA 140 04/02/2020   K 3.7 04/02/2020   CO2 28 04/02/2020   GLUCOSE 172 (H) 04/02/2020   BUN 24 (H) 04/02/2020   CREATININE 1.42 (H)  04/02/2020   CALCIUM 10.2 04/02/2020   CALCIUM 10.2 04/02/2020   GFRNONAA 42 (L) 03/08/2015       Latest Ref Rng & Units 04/02/2020    3:24 PM 03/08/2015    9:55 AM 06/01/2013    7:40 PM  CMP  Glucose 70 - 99 mg/dL 172  291  221   BUN 6 - 23 mg/dL '24  30  19   '$ Creatinine 0.40 - 1.20 mg/dL 1.42  1.28  1.00   Sodium 135 - 145 mEq/L 140  139  139   Potassium 3.5 - 5.1 mEq/L 3.7  4.6  4.3   Chloride 96 - 112 mEq/L 103  105  102   CO2 19 - 32 mEq/L '28  26  23   '$ Calcium 8.4 - 10.5 mg/dL 8.6 - 10.4 mg/dL 10.2    10.2  9.9  10.3   Total Protein 6.0 - 8.3 g/dL   7.7   Total Bilirubin 0.3 - 1.2 mg/dL   0.3   Alkaline Phos 39 - 117 U/L   107   AST 0 - 37 U/L   19   ALT 0 - 35 U/L   17       Latest Ref Rng & Units 03/08/2015    9:55 AM 06/01/2013    7:40 PM 04/02/2013   11:15 AM  CBC  WBC 4.0 - 10.5 K/uL 5.1  6.5  6.5   Hemoglobin 12.0 - 15.0 g/dL 12.8  12.4  13.3   Hematocrit 36.0 - 46.0 % 39.8  37.3  39.4   Platelets 150 - 400 K/uL 191  230  205     Lipid Panel No results for input(s): "CHOL", "TRIG", "[facility]", "VLDL", "HDL", "CHOLHDL", "LDLDIRECT" in the last 8760 hours.  HEMOGLOBIN A1C Lab Results  Component Value Date   HGBA1C (H) 05/09/2008    6.5 (NOTE) The ADA recommends the following therapeutic goal for glycemic control related to Hgb A1c measurement: Goal of therapy: <6.5 Hgb A1c  Reference: American Diabetes Association: Clinical Practice Recommendations 2010, Diabetes Care, 2010, 33: (Suppl  1).   MPG 140 05/09/2008   TSH No results for input(s): "TSH" in the last 8760 hours.  External labs:   02/24/2021 Cholesterol 145  Triglycerides 110  HDL 56  LDL 67  Hgb A1c 6.9%  BUN 26 Creatinine 1.28  Estimated GFR 48  Radiology:    Cardiac Studies:   Echocardiogram 10/05/2021: Normal LV systolic function with visual EF 55-60%. Left ventricle cavity is normal in size. Moderate concentric hypertrophy of the left ventricle. Normal global wall motion. Doppler evidence of grade I (impaired) diastolic dysfunction, normal LAP. Calculated EF 57%. Left atrial cavity is mildly dilated. Trileaflet aortic valve.  Trace aortic regurgitation. Mild aortic valve leaflet calcification. Structurally normal mitral valve.  Mild (Grade I) mitral regurgitation. Structurally normal tricuspid valve.  Mild tricuspid regurgitation. No evidence of pulmonary hypertension. No prior available for comparison.   Exercise nuclear stress test 09/29/21 Myocardial perfusion is normal. Low risk study. Overall LV systolic function is normal without regional wall motion abnormalities. Stress LV EF: 55%.  Normal ECG stress. The patient exercised for 4 minutes and 29 seconds of a Bruce protocol, achieving approximately 6.44 METs and reached 112% MPHR. The heart rate response was normal. The blood pressure response was normal. No previous exam available for comparison.  CCS 07/2021 IMPRESSION: 1. Coronary calcium score of 2409. This was 99th percentile for age-, race-, and sex-matched controls. 2. Aortic atherosclerosis and aortic  valve calcification. 3. Borderline dilated aorta at 37 mm (non-contrast, level of the main PA bifurcation).   EKG:   09/09/2021 EKG - NSR, low voltage  Assessment     ICD-10-CM   1. Stage 3a chronic kidney disease (HCC)  L27.51 Basic metabolic panel    2. Essential hypertension  Z00 Basic metabolic panel    3. HYPERCHOLESTEROLEMIA  IIA  F74.94 Basic metabolic panel        Orders Placed This Encounter  Procedures  . Basic metabolic panel    No orders of the defined types were placed in this encounter.   Medications Discontinued During This Encounter  Medication Reason  . rosuvastatin (CRESTOR) 20 MG tablet Dose change       Recommendations:   OMELIA MARQUART is a 76 y.o.  F with significantly elevated CCS, placing her in the 99th percentile risk for her age.  Coronary artery disease involving coronary bypass graft of native heart without angina pectoris High coronary artery calcium score Reviewed results of echo and exercise stress test with patient. She is not experiencing any chest pain or shortness of breath. Will continue with medication management at this time. She is on Aspirin '81mg'$  daily. Will optimize medications for better blood pressure control.  Essential hypertension Blood pressure not well controlled. Goal is <130/80.  Continue Irbesartan, MetoprololXL '100mg'$  daily, torsemide '20mg'$  daily, Spironolactone '25mg'$  daily, and amlodipine '5mg'$  daily. Continue other medications as ordered. Continue exercise at least 30 minutes 3-4 times per week.  Type 2 diabetes mellitus with complication, without long-term current use of insulin (HCC) Well controlled on semaglutide, managed by PCP.  Stage 3a chronic kidney disease ([place]) Followed by nephrology. Labs are stable.  HYPERCHOLESTEROLEMIA  IIA  continue on '20mg'$  daily. New script sent   Follow-up in 6 months or sooner if needed.    Floydene Flock, DO  01/07/2022, 10:16 AM Office: [phone] Pager: [phone]

## 2022-01-07 NOTE — Telephone Encounter (Signed)
Patient's home blood pressure is fairly controlled on current antihypertensive regimen.  Average Systolic BP Level 002 mmHg Lowest Systolic BP Level 984 mmHg Highest Systolic BP Level 730 mmHg  01/05/2022 Wednesday at 01:44 PM 127 / 82      01/04/2022 Tuesday at 09:18 AM 118 / 79      01/02/2022 'Sunday at 09:06 AM 124 / 89      01/01/2022 Saturday at 10:45 AM 114 / 73      12/31/2021 Friday at 09:51 AM 128 / 83      12/30/2021 Thursday at 08:32 AM 131 / 91      12/29/2021 Wednesday at 10:02 AM 127 / 81      12/26/2021 Sunday at 09:27 AM 136 / 92      12/25/2021 Saturday at 08:52 AM 147 / 97      11'$ /21/2023 Tuesday at 07:23 AM 145 / 94

## 2022-01-26 DIAGNOSIS — I10 Essential (primary) hypertension: Secondary | ICD-10-CM | POA: Diagnosis not present

## 2022-01-28 ENCOUNTER — Telehealth: Payer: Self-pay | Admitting: Cardiology

## 2022-01-28 NOTE — Telephone Encounter (Signed)
Dr Shellia Carwin I am a clinical pharmacist practitioner with [facility] and also practice at [facility].  Ms [name] is a mutual patient of ours.  Her PCP is Dr Deland Pretty.    Her most recent Scr on 01/26/22 was 1.96.  eGFR 28.  Her baseline Scr is ~1.2-1.4.  I am going to fax you the labs we have since 2021.    It seems she has been a little frustrated by having multiple people manage her meds.  I was thinking of either holding the spironolactone or decreasing her torsemide.    I just wanted your input.   Thanks  Bennye Alm, PharmD, CPP

## 2022-02-03 DIAGNOSIS — N179 Acute kidney failure, unspecified: Secondary | ICD-10-CM | POA: Diagnosis not present

## 2022-02-03 DIAGNOSIS — I10 Essential (primary) hypertension: Secondary | ICD-10-CM | POA: Diagnosis not present

## 2022-02-03 DIAGNOSIS — E782 Mixed hyperlipidemia: Secondary | ICD-10-CM | POA: Diagnosis not present

## 2022-02-03 DIAGNOSIS — Z7902 Long term (current) use of antithrombotics/antiplatelets: Secondary | ICD-10-CM | POA: Diagnosis not present

## 2022-02-03 DIAGNOSIS — E559 Vitamin D deficiency, unspecified: Secondary | ICD-10-CM | POA: Diagnosis not present

## 2022-02-03 DIAGNOSIS — I2510 Atherosclerotic heart disease of native coronary artery without angina pectoris: Secondary | ICD-10-CM | POA: Diagnosis not present

## 2022-02-03 DIAGNOSIS — I700 Atherosclerosis of aorta: Secondary | ICD-10-CM | POA: Diagnosis not present

## 2022-02-03 DIAGNOSIS — E1121 Type 2 diabetes mellitus with diabetic nephropathy: Secondary | ICD-10-CM | POA: Diagnosis not present

## 2022-02-05 DIAGNOSIS — I10 Essential (primary) hypertension: Secondary | ICD-10-CM | POA: Diagnosis not present

## 2022-02-08 ENCOUNTER — Other Ambulatory Visit: Payer: Self-pay

## 2022-02-08 NOTE — Progress Notes (Signed)
Per discussion with Dr. Shellia Carwin - D/C amlodipine and do not resume spironolactone (holding due to renal function) due to soft BP.   Called and spoke with patient. She will stop taking spironolactone and amlodipine. She will hold irbesartan and metoprolol succinate for the day if her SBP < 100.  02/07/2022 Monday at 11:33 PM 99 / 64      02/07/2022 Monday at 08:27 AM 98 / 63      02/06/2022 'Sunday at 07:41 PM 80 / 52      02/05/2022 Saturday at 09:47 AM 92 / 60      02/04/2022 Friday at 10:33 PM 109 / 70      02/04/2022 Friday at 07:50 AM 114 / 74      02/03/2022 Thursday at 09:31 AM 112 / 75      02/03/2022 Thursday at 01:27 AM 107 / 69      02/02/2022 Wednesday at 09:11 AM 106 / 66      02/02/2022 Wednesday at 01:40 AM 94 / 61      01'$ /03/2022 Tuesday at 09:40 AM 97 / 60

## 2022-02-10 DIAGNOSIS — I10 Essential (primary) hypertension: Secondary | ICD-10-CM | POA: Diagnosis not present

## 2022-02-10 DIAGNOSIS — E559 Vitamin D deficiency, unspecified: Secondary | ICD-10-CM | POA: Diagnosis not present

## 2022-02-10 DIAGNOSIS — N179 Acute kidney failure, unspecified: Secondary | ICD-10-CM | POA: Diagnosis not present

## 2022-02-18 ENCOUNTER — Ambulatory Visit: Payer: Medicare Other | Admitting: Internal Medicine

## 2022-02-18 ENCOUNTER — Encounter: Payer: Self-pay | Admitting: Internal Medicine

## 2022-02-18 ENCOUNTER — Telehealth: Payer: Self-pay

## 2022-02-18 VITALS — BP 156/87 | HR 75 | Resp 14 | Ht 63.0 in | Wt 207.2 lb

## 2022-02-18 DIAGNOSIS — I25810 Atherosclerosis of coronary artery bypass graft(s) without angina pectoris: Secondary | ICD-10-CM

## 2022-02-18 DIAGNOSIS — I10 Essential (primary) hypertension: Secondary | ICD-10-CM | POA: Diagnosis not present

## 2022-02-18 DIAGNOSIS — E118 Type 2 diabetes mellitus with unspecified complications: Secondary | ICD-10-CM

## 2022-02-18 DIAGNOSIS — E7800 Pure hypercholesterolemia, unspecified: Secondary | ICD-10-CM

## 2022-02-18 NOTE — Telephone Encounter (Signed)
Patient home blood pressure is well controlled and soft at times. Most recently discontinued both spironolactone and amlodipine and patient holding BP medications for SBP<100. These changes have helped normalize patient BP.  Before stopping amlodipine (spironolactone was already being held) Average Systolic BP Level 045.91 mmHg Lowest Systolic BP Level 80 mmHg Highest Systolic BP Level 368 mmHg  After stopping medications Average Systolic BP Level 599.2 mmHg Lowest Systolic BP Level 75 mmHg Highest Systolic BP Level 341 mmHg  02/17/2022 Thursday at 09:49 PM 104 / 67      02/17/2022 Thursday at 09:16 AM 125 / 82      02/16/2022 Wednesday at 12:48 AM 120 / 79      02/15/2022 Tuesday at 09:33 AM 110 / 67      02/15/2022 Tuesday at 12:53 AM 100 / 62      02/14/2022 Monday at 01:02 PM 75 / 52      02/13/2022 'Sunday at 07:38 AM 125 / 84      02/13/2022 Sunday at 12:15 AM 112 / 73      02/12/2022 Saturday at 07:29 AM 110 / 72      02/11/2022 Friday at 08:19 AM 123 / 81      01'$ /12/2022 Thursday at 09:15 AM 114 / 75

## 2022-02-18 NOTE — Progress Notes (Signed)
Primary Physician/Referring:  Deland Pretty, MD  Patient ID: [name], female    DOB: 1945-02-12, 77 y.o.   MRN: [id number]  Chief Complaint  Patient presents with   Coronary Artery Disease   Follow-up   HPI:    PAYAL STANFORTH  is a 77 y.o. female with past medical history significant for hypertension, diabetes, and obesity. Patient is here for a follow-up visit.  Her blood pressure regiment is finally where it needs to be. We have been communicating with her other providers and pharmacists regarding any medication changes. Patient is happy that all of her doctor's are now on the same page. She is upset that her pressure is high today but at home her numbers are great. She does not have any high BP readings at home which we are monitoring through our RPM program. Patient is back in the gym and exercising more now. She is feeling good. She denies chest pain, shortness of breath, palpitations, diaphoresis, syncope, edema, PND, orthopnea.    Past Medical History:  Diagnosis Date   Diabetes mellitus    GERD (gastroesophageal reflux disease)    Glaucoma    Glaucoma    Hypercholesteremia    Hypertension    Obesity    Past Surgical History:  Procedure Laterality Date   ABDOMINAL HYSTERECTOMY     BREAST EXCISIONAL BIOPSY Left    benign   EYE SURGERY     TONSILLECTOMY     Family History  Problem Relation Age of Onset   Heart disease Mother    Cancer Mother    Hypertension Sister    Hypertension Sister    Heart disease Sister    Hypertension Sister    Hypertension Sister    Hypertension Brother    Hypertension Brother    Breast cancer Maternal Aunt    Cancer Other    Hyperlipidemia Other    Hypercalcemia Neg Hx     Social History   Tobacco Use   Smoking status: Never   Smokeless tobacco: Never  Substance Use Topics   Alcohol use: No   Marital Status: Married  ROS  Review of Systems  Constitutional: Negative. Negative for malaise/fatigue.  Cardiovascular:   Negative for chest pain, claudication, dyspnea on exertion, leg swelling, near-syncope, orthopnea, palpitations and syncope.  Respiratory: Negative.  Negative for shortness of breath.   Gastrointestinal: Negative.   Neurological:  Negative for dizziness.   Objective  Blood pressure (!) 156/87, pulse 75, resp. rate 14, height '5\' 3"'$  (1.6 m), weight 207 lb 3.2 oz (94 kg), SpO2 98 %. Body mass index is 36.7 kg/m.     02/18/2022    9:17 AM 02/18/2022    9:09 AM 01/07/2022    9:20 AM  Vitals with BMI  Height  '5\' 3"'$  '5\' 3"'$   Weight  207 lbs 3 oz 206 lbs 3 oz  BMI  37.10 62.69  Systolic 485 462 703  Diastolic 87 91 82  Pulse 75 78 82     Physical Exam Constitutional:      General: She is not in acute distress.    Appearance: Normal appearance.  HENT:     Head: Normocephalic and atraumatic.  Eyes:     Extraocular Movements: Extraocular movements intact.  Neck:     Vascular: No carotid bruit.  Cardiovascular:     Rate and Rhythm: Normal rate and regular rhythm.     Pulses: Normal pulses.     Heart sounds: Normal heart sounds. No murmur  heard.    No friction rub. No gallop.  Pulmonary:     Effort: Pulmonary effort is normal.     Breath sounds: Normal breath sounds.  Abdominal:     General: Bowel sounds are normal.     Palpations: Abdomen is soft.  Musculoskeletal:     Right lower leg: No edema.     Left lower leg: No edema.  Skin:    General: Skin is warm and dry.  Neurological:     Mental Status: She is alert.    Medications and allergies   Allergies  Allergen Reactions   Methazolamide Rash   Sulfa Antibiotics Other (See Comments)    PATIENT HAS TO MONITOR THE USE OF ANY SULFA PRODUCTS.  IF IT TOUCHES HER SCALP IT CAUSES HER GREAT PAIN AND HEAT.  NOT SURE OF OTHER REACTIONS THAT SHE HAS. Severe head pain with hair products    Acetaminophen    Hydrocodone    Hydrocodone-Acetaminophen     Out of body experience   Sulfonamide Derivatives     Severe head pain with hair  products   Latex Rash     Medication list after today's encounter   Current Outpatient Medications:    acetaminophen (TYLENOL 8 HOUR) 650 MG CR tablet, 1 tablet, Disp: , Rfl:    AgaMatrix Ultra-Thin Lancets MISC, USE   TO CHECK GLUCOSE TWICE DAILY AS DIRECTED, Disp: , Rfl:    aspirin EC 81 MG tablet, Take 81 mg by mouth daily., Disp: , Rfl:    brimonidine-timolol (COMBIGAN) 0.2-0.5 % ophthalmic solution, Place 1 drop into both eyes every 12 (twelve) hours., Disp: , Rfl:    dapagliflozin propanediol (FARXIGA) 10 MG TABS tablet, 1 tablet, Disp: , Rfl:    esomeprazole (NEXIUM) 40 MG capsule, Take 40 mg by mouth daily as needed. Acid reflux, Disp: , Rfl:    glucose blood (ONETOUCH VERIO) test strip, USE 1 STRIP TO CHECK GLUCOSE TWICE DAILY, Disp: , Rfl:    glucose blood (ONETOUCH VERIO) test strip, USE 1 STRIP TO CHECK GLUCOSE TWICE DAILY AS DIRECTED, Disp: , Rfl:    irbesartan (AVAPRO) 300 MG tablet, Take 1 tablet by mouth daily., Disp: , Rfl:    latanoprost (XALATAN) 0.005 % ophthalmic solution, 1 drop into each eye in the evening, Disp: , Rfl:    metoprolol succinate (TOPROL-XL) 100 MG 24 hr tablet, Take 1 tablet (100 mg total) by mouth at bedtime. Take with or immediately following a meal. (Patient taking differently: Take 50 mg by mouth at bedtime. Take with or immediately following a meal.), Disp: 90 tablet, Rfl: 2   potassium chloride SA (KLOR-CON M) 20 MEQ tablet, Take 20 mEq by mouth 2 (two) times daily., Disp: , Rfl:    pregabalin (LYRICA) 50 MG capsule, Take 50 mg by mouth at bedtime., Disp: , Rfl:    rosuvastatin (CRESTOR) 40 MG tablet, Take 20 mg by mouth daily., Disp: , Rfl:    Semaglutide, 1 MG/DOSE, (OZEMPIC, 1 MG/DOSE,) 4 MG/3ML SOPN, 1 mg, Disp: , Rfl:    torsemide (DEMADEX) 20 MG tablet, Take 1 tablet (20 mg total) by mouth daily., Disp: 90 tablet, Rfl: 3  Laboratory examination:   Lab Results  Component Value Date   NA 140 04/02/2020   K 3.7 04/02/2020   CO2 28  04/02/2020   GLUCOSE 172 (H) 04/02/2020   BUN 24 (H) 04/02/2020   CREATININE 1.42 (H) 04/02/2020   CALCIUM 10.2 04/02/2020   CALCIUM 10.2 04/02/2020  GFRNONAA 42 (L) 03/08/2015       Latest Ref Rng & Units 04/02/2020    3:24 PM 03/08/2015    9:55 AM 06/01/2013    7:40 PM  CMP  Glucose 70 - 99 mg/dL 172  291  221   BUN 6 - 23 mg/dL '24  30  19   '$ Creatinine 0.40 - 1.20 mg/dL 1.42  1.28  1.00   Sodium 135 - 145 mEq/L 140  139  139   Potassium 3.5 - 5.1 mEq/L 3.7  4.6  4.3   Chloride 96 - 112 mEq/L 103  105  102   CO2 19 - 32 mEq/L '28  26  23   '$ Calcium 8.4 - 10.5 mg/dL 8.6 - 10.4 mg/dL 10.2    10.2  9.9  10.3   Total Protein 6.0 - 8.3 g/dL   7.7   Total Bilirubin 0.3 - 1.2 mg/dL   0.3   Alkaline Phos 39 - 117 U/L   107   AST 0 - 37 U/L   19   ALT 0 - 35 U/L   17       Latest Ref Rng & Units 03/08/2015    9:55 AM 06/01/2013    7:40 PM 04/02/2013   11:15 AM  CBC  WBC 4.0 - 10.5 K/uL 5.1  6.5  6.5   Hemoglobin 12.0 - 15.0 g/dL 12.8  12.4  13.3   Hematocrit 36.0 - 46.0 % 39.8  37.3  39.4   Platelets 150 - 400 K/uL 191  230  205     Lipid Panel No results for input(s): "CHOL", "TRIG", "[place]", "VLDL", "HDL", "CHOLHDL", "LDLDIRECT" in the last 8760 hours.  HEMOGLOBIN A1C Lab Results  Component Value Date   HGBA1C (H) 05/09/2008    6.5 (NOTE) The ADA recommends the following therapeutic goal for glycemic control related to Hgb A1c measurement: Goal of therapy: <6.5 Hgb A1c  Reference: American Diabetes Association: Clinical Practice Recommendations 2010, Diabetes Care, 2010, 33: (Suppl  1).   MPG 140 05/09/2008   TSH No results for input(s): "TSH" in the last 8760 hours.  External labs:   02/24/2021 Cholesterol 145  Triglycerides 110  HDL 56  LDL 67  Hgb A1c 6.9%  BUN 26 Creatinine 1.28  Estimated GFR 48  02/10/2022 Creatinine 1.85 however since then spironolactone has been stopped and BP has been very well controlled  Radiology:    Cardiac Studies:   Echocardiogram 10/05/2021: Normal LV systolic function with visual EF 55-60%. Left ventricle cavity is normal in size. Moderate concentric hypertrophy of the left ventricle. Normal global wall motion. Doppler evidence of grade I (impaired) diastolic dysfunction, normal LAP. Calculated EF 57%. Left atrial cavity is mildly dilated. Trileaflet aortic valve.  Trace aortic regurgitation. Mild aortic valve leaflet calcification. Structurally normal mitral valve.  Mild (Grade I) mitral regurgitation. Structurally normal tricuspid valve.  Mild tricuspid regurgitation. No evidence of pulmonary hypertension. No prior available for comparison.   Exercise nuclear stress test 09/29/21 Myocardial perfusion is normal. Low risk study. Overall LV systolic function is normal without regional wall motion abnormalities. Stress LV EF: 55%.  Normal ECG stress. The patient exercised for 4 minutes and 29 seconds of a Bruce protocol, achieving approximately 6.44 METs and reached 112% MPHR. The heart rate response was normal. The blood pressure response was normal. No previous exam available for comparison.  CCS 07/2021 IMPRESSION: 1. Coronary calcium score of 2409. This was 99th percentile for age-, race-, and sex-matched controls.  2. Aortic atherosclerosis and aortic valve calcification. 3. Borderline dilated aorta at 37 mm (non-contrast, level of the main PA bifurcation).   EKG:   09/09/2021 EKG - NSR, low voltage  Assessment     ICD-10-CM   1. Essential hypertension  I10 EKG 12-Lead    CMP14+EGFR    2. Type 2 diabetes mellitus with complication, without long-term current use of insulin (HCC)  E11.8     3. HYPERCHOLESTEROLEMIA  IIA  E78.00     4. Coronary artery disease involving coronary bypass graft of native heart without angina pectoris  I25.810         Orders Placed This Encounter  Procedures   CMP14+EGFR   EKG 12-Lead    No orders of the defined types were placed in this  encounter.   Medications Discontinued During This Encounter  Medication Reason   metFORMIN (GLUCOPHAGE-XR) 500 MG 24 hr tablet Discontinued by provider      Recommendations:   NOTNAMED CROUCHER is a 77 y.o.  F with significantly elevated CCS, placing her in the 99th percentile risk for her age.   Coronary artery disease involving coronary bypass graft of native heart without angina pectoris High coronary artery calcium score Reviewed results of echo and exercise stress test with patient. She is not experiencing any chest pain or shortness of breath. Will continue with medication management at this time. She is on Aspirin '81mg'$  daily.    Essential hypertension Blood pressure is high in the office but very well controlled at home, if not soft at times  Continue Irbesartan, MetoprololXL '100mg'$  daily, torsemide '20mg'$  daily - hold parameters in place for irbesartan Continue exercise at least 30 minutes 3-4 times per week.  *RPM DATA: Patient home blood pressure is well controlled and soft at times. Most recently discontinued both spironolactone and amlodipine and patient holding BP medications for SBP<100. These changes have helped normalize patient BP.   Before stopping amlodipine (spironolactone was already being held) Average Systolic BP Level 390.30 mmHg Lowest Systolic BP Level 80 mmHg Highest Systolic BP Level 092 mmHg   After stopping medications Average Systolic BP Level 330.0 mmHg Lowest Systolic BP Level 75 mmHg Highest Systolic BP Level 762 mmHg   02/17/2022 Thursday at 09:49 PM     104 / 67                02/17/2022 Thursday at 09:16 AM     125 / 82                02/16/2022 Wednesday at 12:48 AM 120 / 79                02/15/2022 Tuesday at 09:33 AM      110 / 67                02/15/2022 Tuesday at 12:53 AM      100 / 62                02/14/2022 Monday at 01:02 PM       75 / 52      02/13/2022 'Sunday at 07:38 AM        125 / 84                02/13/2022 Sunday at  12:15 AM        112 / 73                01'$ /13/2024 Saturday at 07:29 AM  110 / 72                02/11/2022 Friday at 08:19 AM          123 / 81                02/10/2022 Thursday at 09:15 AM     114 / 75    Type 2 diabetes mellitus with complication, without long-term current use of insulin (HCC) Well controlled on semaglutide, managed by PCP.   Stage 3a chronic kidney disease ([place]) Followed by nephrology. Labs are stable. 02/10/2022 Creatinine 1.85 however since then spironolactone has been stopped and BP has been very well controlled Repeat CMP ordered Patient has appointment with nephrology tomorrow Patient wants her providers communicating regarding her BP meds and changes which is being done   Follow-up in 3 months or sooner if needed.    Floydene Flock, DO  02/18/2022, 11:13 AM Office: [phone] Pager: [phone]

## 2022-02-25 ENCOUNTER — Encounter: Payer: Self-pay | Admitting: Podiatry

## 2022-02-25 ENCOUNTER — Ambulatory Visit: Payer: Medicare Other | Admitting: Podiatry

## 2022-02-25 DIAGNOSIS — M79675 Pain in left toe(s): Secondary | ICD-10-CM | POA: Diagnosis not present

## 2022-02-25 DIAGNOSIS — M79674 Pain in right toe(s): Secondary | ICD-10-CM

## 2022-02-25 DIAGNOSIS — E0843 Diabetes mellitus due to underlying condition with diabetic autonomic (poly)neuropathy: Secondary | ICD-10-CM | POA: Diagnosis not present

## 2022-02-25 DIAGNOSIS — B351 Tinea unguium: Secondary | ICD-10-CM

## 2022-02-25 NOTE — Progress Notes (Signed)
This patient returns to my office for at risk foot care.  This patient requires this care by a professional since this patient will be at risk due to having dabetes and CKD.   This patient is unable to cut nails herself since the patient cannot reach her nails.These nails are painful walking and wearing shoes.  This patient presents for at risk foot care today.  General Appearance  Alert, conversant and in no acute stress.  Vascular  Dorsalis pedis  pulses are palpable  bilaterally. Posterior tibial pulses are absent  bilaterally. Capillary return is within normal limits  bilaterally. Temperature is within normal limits  bilaterally.  Neurologic  Senn-Weinstein monofilament wire test within normal limits  bilaterally. Muscle power within normal limits bilaterally.  Nails Thick disfigured discolored nails with subungual debris  from hallux to fifth toes bilaterally. No evidence of bacterial infection or drainage bilaterally.  Orthopedic  No limitations of motion  feet .  No crepitus or effusions noted.  No bony pathology or digital deformities noted.  HAV  B/L.  Peroneal spastic flaftoot left.    Skin  normotropic skin with no porokeratosis noted bilaterally.  No signs of infections or ulcers noted.     Onychomycosis  Pain in right toes  Pain in left toes  Consent was obtained for treatment procedures.   Mechanical debridement of nails 1-5  bilaterally performed with a nail nipper.  Filed with dremel without incident.    Return office visit   3  months                   Told patient to return for periodic foot care and evaluation due to potential at risk complications.   Gardiner Barefoot DPM

## 2022-03-03 DIAGNOSIS — G8929 Other chronic pain: Secondary | ICD-10-CM | POA: Diagnosis not present

## 2022-03-03 DIAGNOSIS — N1832 Chronic kidney disease, stage 3b: Secondary | ICD-10-CM | POA: Diagnosis not present

## 2022-03-03 DIAGNOSIS — I129 Hypertensive chronic kidney disease with stage 1 through stage 4 chronic kidney disease, or unspecified chronic kidney disease: Secondary | ICD-10-CM | POA: Diagnosis not present

## 2022-03-03 DIAGNOSIS — N2581 Secondary hyperparathyroidism of renal origin: Secondary | ICD-10-CM | POA: Diagnosis not present

## 2022-03-03 DIAGNOSIS — E1122 Type 2 diabetes mellitus with diabetic chronic kidney disease: Secondary | ICD-10-CM | POA: Diagnosis not present

## 2022-03-03 DIAGNOSIS — I701 Atherosclerosis of renal artery: Secondary | ICD-10-CM | POA: Diagnosis not present

## 2022-03-08 DIAGNOSIS — I10 Essential (primary) hypertension: Secondary | ICD-10-CM | POA: Diagnosis not present

## 2022-03-22 DIAGNOSIS — E782 Mixed hyperlipidemia: Secondary | ICD-10-CM | POA: Diagnosis not present

## 2022-03-22 DIAGNOSIS — I10 Essential (primary) hypertension: Secondary | ICD-10-CM | POA: Diagnosis not present

## 2022-03-22 DIAGNOSIS — N184 Chronic kidney disease, stage 4 (severe): Secondary | ICD-10-CM | POA: Diagnosis not present

## 2022-03-22 DIAGNOSIS — I2510 Atherosclerotic heart disease of native coronary artery without angina pectoris: Secondary | ICD-10-CM | POA: Diagnosis not present

## 2022-03-22 DIAGNOSIS — E1121 Type 2 diabetes mellitus with diabetic nephropathy: Secondary | ICD-10-CM | POA: Diagnosis not present

## 2022-04-07 DIAGNOSIS — I10 Essential (primary) hypertension: Secondary | ICD-10-CM | POA: Diagnosis not present

## 2022-04-19 DIAGNOSIS — E213 Hyperparathyroidism, unspecified: Secondary | ICD-10-CM | POA: Diagnosis not present

## 2022-04-19 DIAGNOSIS — N184 Chronic kidney disease, stage 4 (severe): Secondary | ICD-10-CM | POA: Diagnosis not present

## 2022-04-21 ENCOUNTER — Other Ambulatory Visit: Payer: Self-pay

## 2022-05-02 ENCOUNTER — Other Ambulatory Visit: Payer: Self-pay

## 2022-05-02 DIAGNOSIS — I10 Essential (primary) hypertension: Secondary | ICD-10-CM

## 2022-05-02 MED ORDER — METOPROLOL SUCCINATE ER 50 MG PO TB24: 50.0000 mg | ORAL_TABLET | Freq: Every day | ORAL | 2 refills | Status: DC

## 2022-05-02 NOTE — Telephone Encounter (Signed)
Patient requesting RF of metoprolol succinate. Was taking 100mg  1/2 tab with evening meal.  Changing to 50mg  daily so patient does not have to split tablet.

## 2022-05-03 DIAGNOSIS — N2581 Secondary hyperparathyroidism of renal origin: Secondary | ICD-10-CM | POA: Diagnosis not present

## 2022-05-03 DIAGNOSIS — I701 Atherosclerosis of renal artery: Secondary | ICD-10-CM | POA: Diagnosis not present

## 2022-05-03 DIAGNOSIS — I129 Hypertensive chronic kidney disease with stage 1 through stage 4 chronic kidney disease, or unspecified chronic kidney disease: Secondary | ICD-10-CM | POA: Diagnosis not present

## 2022-05-03 DIAGNOSIS — E1122 Type 2 diabetes mellitus with diabetic chronic kidney disease: Secondary | ICD-10-CM | POA: Diagnosis not present

## 2022-05-03 DIAGNOSIS — N1832 Chronic kidney disease, stage 3b: Secondary | ICD-10-CM | POA: Diagnosis not present

## 2022-05-07 DIAGNOSIS — I10 Essential (primary) hypertension: Secondary | ICD-10-CM | POA: Diagnosis not present

## 2022-05-10 ENCOUNTER — Ambulatory Visit
Admission: RE | Admit: 2022-05-10 | Discharge: 2022-05-10 | Disposition: A | Discharge status: Home / Self Care | Payer: Medicare Other | Source: Ambulatory Visit | Attending: Internal Medicine | Admitting: Internal Medicine

## 2022-05-10 ENCOUNTER — Other Ambulatory Visit: Payer: Self-pay | Admitting: Internal Medicine

## 2022-05-10 DIAGNOSIS — Z1231 Encounter for screening mammogram for malignant neoplasm of breast: Secondary | ICD-10-CM

## 2022-05-24 ENCOUNTER — Ambulatory Visit (INDEPENDENT_AMBULATORY_CARE_PROVIDER_SITE_OTHER): Payer: Medicare Other | Admitting: Podiatry

## 2022-05-24 ENCOUNTER — Encounter: Payer: Self-pay | Admitting: Podiatry

## 2022-05-24 DIAGNOSIS — M79674 Pain in right toe(s): Secondary | ICD-10-CM | POA: Diagnosis not present

## 2022-05-24 DIAGNOSIS — M79675 Pain in left toe(s): Secondary | ICD-10-CM

## 2022-05-24 DIAGNOSIS — B351 Tinea unguium: Secondary | ICD-10-CM | POA: Diagnosis not present

## 2022-05-24 DIAGNOSIS — E0843 Diabetes mellitus due to underlying condition with diabetic autonomic (poly)neuropathy: Secondary | ICD-10-CM

## 2022-05-24 NOTE — Progress Notes (Signed)
This patient returns to my office for at risk foot care.  This patient requires this care by a professional since this patient will be at risk due to having dabetes and CKD.   This patient is unable to cut nails herself since the patient cannot reach her nails.These nails are painful walking and wearing shoes.  This patient presents for at risk foot care today.  General Appearance  Alert, conversant and in no acute stress.  Vascular  Dorsalis pedis  pulses are palpable  bilaterally. Posterior tibial pulses are absent  bilaterally. Capillary return is within normal limits  bilaterally. Temperature is within normal limits  bilaterally.  Neurologic  Senn-Weinstein monofilament wire test within normal limits  bilaterally. Muscle power within normal limits bilaterally.  Nails Thick disfigured discolored nails with subungual debris  from hallux to fifth toes bilaterally. No evidence of bacterial infection or drainage bilaterally.  Orthopedic  No limitations of motion  feet .  No crepitus or effusions noted.  No bony pathology or digital deformities noted.  HAV  B/L.  Peroneal spastic flaftoot left.    Skin  normotropic skin with no porokeratosis noted bilaterally.  No signs of infections or ulcers noted.     Onychomycosis  Pain in right toes  Pain in left toes  Consent was obtained for treatment procedures.   Mechanical debridement of nails 1-5  bilaterally performed with a nail nipper.  Filed with dremel without incident.    Return office visit   3  months                   Told patient to return for periodic foot care and evaluation due to potential at risk complications.   Dene Landsberg DPM   

## 2022-05-27 ENCOUNTER — Ambulatory Visit: Payer: Medicare Other | Admitting: Podiatry

## 2022-06-06 DIAGNOSIS — I10 Essential (primary) hypertension: Secondary | ICD-10-CM | POA: Diagnosis not present

## 2022-06-09 ENCOUNTER — Encounter: Payer: Self-pay | Admitting: Internal Medicine

## 2022-06-09 ENCOUNTER — Ambulatory Visit: Payer: Medicare Other | Admitting: Internal Medicine

## 2022-06-09 VITALS — BP 150/86 | HR 72 | Ht 63.0 in | Wt 212.0 lb

## 2022-06-09 DIAGNOSIS — I10 Essential (primary) hypertension: Secondary | ICD-10-CM

## 2022-06-09 NOTE — Progress Notes (Signed)
Primary Physician/Referring:  Merri Brunette, MD  Patient ID: [name], female    DOB: [birth year], 77 y.o.   MRN: [id number]  Chief Complaint  Patient presents with   Hypertension   Follow-up   HPI:    [name]  is a 77 y.o. female with past medical history significant for hypertension, diabetes, and obesity. Patient is here for a follow-up visit.  Her blood pressure is perfectly controlled at this time. She checks her blood pressure twice daily and keeps a log which she brings to every office visit and also to her nephrology appointments. When she is here in the office, her pressure is always elevated but home pressures are <130/80 all of the time. She did gain a little weight over the winter and she is wanting to get back in to the gym and start losing weight again like she was when she saw me last. She is feeling good. She denies chest pain, shortness of breath, palpitations, diaphoresis, syncope, edema, PND, orthopnea.    Past Medical History:  Diagnosis Date   Diabetes mellitus    GERD (gastroesophageal reflux disease)    Glaucoma    Glaucoma    Hypercholesteremia    Hypertension    Obesity    Past Surgical History:  Procedure Laterality Date   ABDOMINAL HYSTERECTOMY     BREAST EXCISIONAL BIOPSY Left    benign   EYE SURGERY     TONSILLECTOMY     Family History  Problem Relation Age of Onset   Heart disease Mother    Cancer Mother    Hypertension Sister    Hypertension Sister    Heart disease Sister    Hypertension Sister    Hypertension Sister    Hypertension Brother    Hypertension Brother    Breast cancer Maternal Aunt    Cancer Other    Hyperlipidemia Other    Hypercalcemia Neg Hx     Social History   Tobacco Use   Smoking status: Never   Smokeless tobacco: Never  Substance Use Topics   Alcohol use: No   Marital Status: Married  ROS  Review of Systems  Constitutional: Negative. Negative for malaise/fatigue.  Cardiovascular:   Negative for chest pain, claudication, dyspnea on exertion, leg swelling, near-syncope, orthopnea, palpitations and syncope.  Respiratory: Negative.  Negative for shortness of breath.   Gastrointestinal: Negative.   Neurological:  Negative for dizziness.   Objective  Blood pressure (!) 150/86, pulse 72, height 5\' 3"  (1.6 m), weight 212 lb (96.2 kg), SpO2 98 %. Body mass index is 37.55 kg/m.     06/09/2022    9:25 AM 06/09/2022    9:21 AM 02/18/2022    9:17 AM  Vitals with BMI  Height  5\' 3"    Weight  212 lbs   BMI  37.56   Systolic 150 152 409  Diastolic 86 93 87  Pulse 72 74 75     Physical Exam Constitutional:      General: She is not in acute distress.    Appearance: Normal appearance.  HENT:     Head: Normocephalic and atraumatic.  Eyes:     Extraocular Movements: Extraocular movements intact.  Neck:     Vascular: No carotid bruit.  Cardiovascular:     Rate and Rhythm: Normal rate and regular rhythm.     Pulses: Normal pulses.     Heart sounds: Normal heart sounds. No murmur heard.    No friction rub. No gallop.  Pulmonary:     Effort: Pulmonary effort is normal.     Breath sounds: Normal breath sounds.  Abdominal:     General: Bowel sounds are normal.     Palpations: Abdomen is soft.  Musculoskeletal:     Right lower leg: No edema.     Left lower leg: No edema.  Skin:    General: Skin is warm and dry.  Neurological:     Mental Status: She is alert.    Medications and allergies   Allergies  Allergen Reactions   Methazolamide Rash   Sulfa Antibiotics Other (See Comments)    PATIENT HAS TO MONITOR THE USE OF ANY SULFA PRODUCTS.  IF IT TOUCHES HER SCALP IT CAUSES HER GREAT PAIN AND HEAT.  NOT SURE OF OTHER REACTIONS THAT SHE HAS. Severe head pain with hair products    Acetaminophen    Hydrocodone    Hydrocodone-Acetaminophen     Out of body experience   Sulfonamide Derivatives     Severe head pain with hair products   Latex Rash     Medication list  after today's encounter   Current Outpatient Medications:    aspirin EC 81 MG tablet, Take 81 mg by mouth daily., Disp: , Rfl:    brimonidine-timolol (COMBIGAN) 0.2-0.5 % ophthalmic solution, Place 1 drop into both eyes every 12 (twelve) hours., Disp: , Rfl:    dapagliflozin propanediol (FARXIGA) 10 MG TABS tablet, 1 tablet, Disp: , Rfl:    esomeprazole (NEXIUM) 40 MG capsule, Take 40 mg by mouth daily as needed. Acid reflux, Disp: , Rfl:    glucose blood (ONETOUCH VERIO) test strip, USE 1 STRIP TO CHECK GLUCOSE TWICE DAILY, Disp: , Rfl:    glucose blood (ONETOUCH VERIO) test strip, USE 1 STRIP TO CHECK GLUCOSE TWICE DAILY AS DIRECTED, Disp: , Rfl:    irbesartan (AVAPRO) 300 MG tablet, Take 1 tablet by mouth as needed., Disp: , Rfl:    latanoprost (XALATAN) 0.005 % ophthalmic solution, 1 drop into each eye in the evening, Disp: , Rfl:    metoprolol succinate (TOPROL-XL) 50 MG 24 hr tablet, Take 1 tablet (50 mg total) by mouth daily. Take with or immediately following a meal., Disp: 30 tablet, Rfl: 2   pregabalin (LYRICA) 50 MG capsule, Take 50 mg by mouth at bedtime., Disp: , Rfl:    rosuvastatin (CRESTOR) 40 MG tablet, Take 20 mg by mouth daily., Disp: , Rfl:    Semaglutide, 1 MG/DOSE, (OZEMPIC, 1 MG/DOSE,) 4 MG/3ML SOPN, 1 mg, Disp: , Rfl:    torsemide (DEMADEX) 20 MG tablet, Take 1 tablet (20 mg total) by mouth daily., Disp: 90 tablet, Rfl: 3   acetaminophen (TYLENOL 8 HOUR) 650 MG CR tablet, 1 tablet, Disp: , Rfl:    AgaMatrix Ultra-Thin Lancets MISC, USE   TO CHECK GLUCOSE TWICE DAILY AS DIRECTED, Disp: , Rfl:   Laboratory examination:   Lab Results  Component Value Date   NA 140 04/02/2020   K 3.7 04/02/2020   CO2 28 04/02/2020   GLUCOSE 172 (H) 04/02/2020   BUN 24 (H) 04/02/2020   CREATININE 1.42 (H) 04/02/2020   CALCIUM 10.2 04/02/2020   CALCIUM 10.2 04/02/2020   GFRNONAA 42 (L) 03/08/2015       Latest Ref Rng & Units 04/02/2020    3:24 PM 03/08/2015    9:55 AM 06/01/2013     7:40 PM  CMP  Glucose 70 - 99 mg/dL 161  096  045   BUN 6 -  23 mg/dL 24  30  19    Creatinine 0.40 - 1.20 mg/dL 4.09  8.11  9.14   Sodium 135 - 145 mEq/L 140  139  139   Potassium 3.5 - 5.1 mEq/L 3.7  4.6  4.3   Chloride 96 - 112 mEq/L 103  105  102   CO2 19 - 32 mEq/L 28  26  23    Calcium 8.4 - 10.5 mg/dL 8.6 - 78.2 mg/dL 95.6    21.3  9.9  08.6   Total Protein 6.0 - 8.3 g/dL   7.7   Total Bilirubin 0.3 - 1.2 mg/dL   0.3   Alkaline Phos 39 - 117 U/L   107   AST 0 - 37 U/L   19   ALT 0 - 35 U/L   17       Latest Ref Rng & Units 03/08/2015    9:55 AM 06/01/2013    7:40 PM 04/02/2013   11:15 AM  CBC  WBC 4.0 - 10.5 K/uL 5.1  6.5  6.5   Hemoglobin 12.0 - 15.0 g/dL 57.8  46.9  62.9   Hematocrit 36.0 - 46.0 % 39.8  37.3  39.4   Platelets 150 - 400 K/uL 191  230  205     Lipid Panel No results for input(s): "CHOL", "TRIG", "LDLCALC", "VLDL", "HDL", "CHOLHDL", "LDLDIRECT" in the last 8760 hours.  HEMOGLOBIN A1C Lab Results  Component Value Date   HGBA1C (H) 05/09/2008    6.5 (NOTE) The ADA recommends the following therapeutic goal for glycemic control related to Hgb A1c measurement: Goal of therapy: <6.5 Hgb A1c  Reference: American Diabetes Association: Clinical Practice Recommendations 2010, Diabetes Care, 2010, 33: (Suppl  1).   MPG 140 05/09/2008   TSH No results for input(s): "TSH" in the last 8760 hours.  External labs:   02/24/2021 Cholesterol 145  Triglycerides 110  HDL 56  LDL 67  Hgb A1c 6.9%  BUN 26 Creatinine 1.28  Estimated GFR 48  02/10/2022 Creatinine 1.85  Radiology:    Cardiac Studies:  Echocardiogram 10/05/2021: Normal LV systolic function with visual EF 55-60%. Left ventricle cavity is normal in size. Moderate concentric hypertrophy of the left ventricle. Normal global wall motion. Doppler evidence of grade I (impaired) diastolic dysfunction, normal LAP. Calculated EF 57%. Left atrial cavity is mildly dilated. Trileaflet aortic valve.  Trace  aortic regurgitation. Mild aortic valve leaflet calcification. Structurally normal mitral valve.  Mild (Grade I) mitral regurgitation. Structurally normal tricuspid valve.  Mild tricuspid regurgitation. No evidence of pulmonary hypertension. No prior available for comparison.   Exercise nuclear stress test 09/29/21 Myocardial perfusion is normal. Low risk study. Overall LV systolic function is normal without regional wall motion abnormalities. Stress LV EF: 55%.  Normal ECG stress. The patient exercised for 4 minutes and 29 seconds of a Bruce protocol, achieving approximately 6.44 METs and reached 112% MPHR. The heart rate response was normal. The blood pressure response was normal. No previous exam available for comparison.  CCS 07/2021 IMPRESSION: 1. Coronary calcium score of 2409. This was 99th percentile for age-, race-, and sex-matched controls. 2. Aortic atherosclerosis and aortic valve calcification. 3. Borderline dilated aorta at 37 mm (non-contrast, level of the main PA bifurcation).   EKG:   09/09/2021 EKG - NSR, low voltage  Assessment     ICD-10-CM   1. Essential hypertension  I10         No orders of the defined types were placed in this encounter.  No orders of the defined types were placed in this encounter.   Medications Discontinued During This Encounter  Medication Reason   potassium chloride SA (KLOR-CON M) 20 MEQ tablet Completed Course      Recommendations:   YESSIKA KRAUSHAAR is a 77 y.o.  F with significantly elevated CCS, placing her in the 99th percentile risk for her age.   Coronary artery disease involving coronary bypass graft of native heart without angina pectoris High coronary artery calcium score No ischemia on echo or stress test She is not experiencing any chest pain or shortness of breath. Will continue with medication management at this time. She is on Aspirin 81mg  daily.    Essential hypertension Blood pressure is high in the  office but very well controlled at home, if not soft at times  Continue Irbesartan, MetoprololXL 100mg  daily, torsemide 20mg  daily - hold parameters in place for irbesartan Continue exercise at least 30 minutes 3-4 times per week. She checks her blood pressure twice daily and records the numbers, she brings them to every office visit with me and nephrology   Type 2 diabetes mellitus with complication, without long-term current use of insulin (HCC) Well controlled on semaglutide, managed by PCP.   Stage 3a chronic kidney disease (HCC) Followed by nephrology. Labs are stable. 02/10/2022 Creatinine 1.95  Patient has appointment with nephrology coming up She brings her BP log and her pressures are very well controlled   Follow-up in 6 months or sooner if needed.    Clotilde Dieter, DO  06/09/2022, 9:49 AM Office: [phone] Pager: [phone]

## 2022-06-23 DIAGNOSIS — E782 Mixed hyperlipidemia: Secondary | ICD-10-CM | POA: Diagnosis not present

## 2022-06-23 DIAGNOSIS — E1121 Type 2 diabetes mellitus with diabetic nephropathy: Secondary | ICD-10-CM | POA: Diagnosis not present

## 2022-06-23 DIAGNOSIS — I10 Essential (primary) hypertension: Secondary | ICD-10-CM | POA: Diagnosis not present

## 2022-07-04 ENCOUNTER — Ambulatory Visit (INDEPENDENT_AMBULATORY_CARE_PROVIDER_SITE_OTHER): Payer: Medicare Other

## 2022-07-04 ENCOUNTER — Ambulatory Visit: Payer: Medicare Other | Admitting: Podiatry

## 2022-07-04 ENCOUNTER — Encounter: Payer: Self-pay | Admitting: Podiatry

## 2022-07-04 DIAGNOSIS — M76821 Posterior tibial tendinitis, right leg: Secondary | ICD-10-CM

## 2022-07-04 DIAGNOSIS — M7751 Other enthesopathy of right foot: Secondary | ICD-10-CM

## 2022-07-04 MED ORDER — DEXAMETHASONE SODIUM PHOSPHATE 120 MG/30ML IJ SOLN
4.0000 mg | Freq: Once | INTRAMUSCULAR | Status: AC
  Administered 2022-07-04: 4 mg via INTRA_ARTICULAR

## 2022-07-04 NOTE — Patient Instructions (Signed)
Posterior Tibial Tendinitis Posterior tibial tendinitis is irritation of a tendon called the posterior tibial tendon. Your posterior tibial tendon is a cord-like tissue that connects calf muscles to your foot. They work together to: Human resources officer. Help you rise up on your toes. Help you turn your foot down and in (inversion). This condition causes foot and ankle pain. It can also lead to a flat foot. What are the causes? This condition is most often caused by repeated stress to the tendon (overuse injury). It can also be caused by a sudden injury that stresses the tendon, such as landing on your foot after jumping or falling. What increases the risk? This condition is more likely to develop in: People who play a sport that involves putting a lot of pressure on the feet, such as basketball, tennis, soccer, and hockey. Runners. Females who are older than 75 years of age and are overweight. People with diabetes. People with decreased foot stability. People with flat feet. What are the signs or symptoms? Symptoms include: Pain in the inner ankle. Pain at the arch of your foot. Pain that gets worse with running, walking, or standing. Swelling on the inside of your ankle and foot. Weakness in your ankle or foot. Inability to stand up on tiptoe. Flattening of the arch of your foot. How is this diagnosed? This condition may be diagnosed based on: Your symptoms. Your medical history. A physical exam. Tests, such as X-ray, MRI, or ultrasound. How is this treated? This condition may be treated by: Putting ice on the injured area. Taking NSAIDs, such as ibuprofen, to reduce pain and swelling. Wearing a special shoe or shoe insert to support your arch (orthotic). Having physical therapy. Replacing high-impact exercise with low-impact exercise, such as swimming or cycling. If your symptoms do not improve with these treatments, you may need to wear a splint, removable walking boot, or  short leg cast for 6-8 weeks to keep your foot and ankle still (immobilized). Follow these instructions at home: If you have a nonremovable cast or splint: Do not put pressure on any part of the cast or splint until it is fully hardened. This may take several hours. Do not stick anything inside the cast or splint to scratch your skin. Doing that increases your risk of infection. Check the skin around the cast or splint every day. Tell your health care provider about any concerns. You may put lotion on dry skin around the edges of the cast or splint. Do not put lotion on the skin underneath it. Keep the cast or splint clean and dry. If you have a removable boot: Wear the boot as told by your provider. Remove it only as told by your provider. Check the skin around the boot every day. Tell your provider about any concerns. Loosen the boot if your toes tingle, become numb, or turn cold and blue. Keep the boot clean and dry. Bathing Do not take baths, swim, or use a hot tub until your provider approves. Ask your provider if you may take showers. If your cast, splint, or boot is not waterproof: Do not let it get wet. Cover it with a waterproof covering while you take a bath or a shower. Managing pain and swelling  If told, put ice on the injured area. If you have a removable boot, remove it as told by your provider. Put ice in a plastic bag. Place a towel between your skin and the bag or between your cast and the bag.  Leave the ice on for 20 minutes, 2-3 times a day. If your skin turns bright red, remove the ice right away to prevent skin damage. The risk of damage is higher if you cannot feel pain, heat, or cold. Move your toes often to reduce stiffness and swelling. Raise (elevate) the injured area above the level of your heart while you are sitting or lying down. Activity Do not use the injured foot to support your body weight until your provider says that you can. Use crutches as told by  your provider. Do not do activities that make pain or swelling worse. Ask your provider when it is safe to drive if you have a cast, splint, or boot on your foot. Do exercises as told by your provider. Return to your normal activities as told by your provider. Ask your provider what activities are safe for you. General instructions Take over-the-counter and prescription medicines only as told by your provider. If you have an orthotic, use it as told by your provider. How is this prevented? Wear footwear that is appropriate for your athletic activity. Avoid athletic activities that cause pain or swelling in your ankle or foot. Warm up and stretch before being active. Stop activity if you develop pain or swelling while training. See your provider if you have pain or swelling that does not improve after a few days of rest. Start a new athletic activity slowly so you can build up your strength and flexibility. Contact a health care provider if: Your symptoms get worse. Your symptoms do not improve in 6-8 weeks. You develop new, unexplained symptoms. Your splint, boot, or cast gets damaged. This information is not intended to replace advice given to you by your health care provider. Make sure you discuss any questions you have with your health care provider. Document Revised: 02/01/2022 Document Reviewed: 02/01/2022 Elsevier Patient Education  2024 [employer].

## 2022-07-04 NOTE — Progress Notes (Signed)
  Subjective:  Patient ID: [name], female    DOB: [birth year],   MRN: [id number]  Chief Complaint  Patient presents with   Ankle Pain    Ankle (medial) right - aching x 2 weeks, no injury, sensitive to even touch, no swelling or discoloration, history of tendonitis, trying to wear insoles and sneakers more, but hasn't gotten better, also try aspercreme    77 y.o. female presents for new concern today of right ankle pain that has been going on for about 2 weeks. Very sensitive to the touch on the inside of the ankle. Relates she has tried insoles and sneakers and aspercream with minimal relief. .  Patient is diabetic and last A1c was  Lab Results  Component Value Date   HGBA1C (H) 05/09/2008    6.5 (NOTE) The ADA recommends the following therapeutic goal for glycemic control related to Hgb A1c measurement: Goal of therapy: <6.5 Hgb A1c  Reference: American Diabetes Association: Clinical Practice Recommendations 2010, Diabetes Care, 2010, 33: (Suppl  1).   .   PCP:  Merri Brunette, MD    . Denies any other pedal complaints. Denies n/v/f/c.   Past Medical History:  Diagnosis Date   Diabetes mellitus    GERD (gastroesophageal reflux disease)    Glaucoma    Glaucoma    Hypercholesteremia    Hypertension    Obesity     Objective:  Physical Exam: Vascular: DP/PT pulses 2/4 bilateral. CFT <3 seconds. Normal hair growth on digits. No edema.  Skin. No lacerations or abrasions bilateral feet.  Musculoskeletal: MMT 5/5 bilateral lower extremities in DF, PF, Inversion and Eversion. Deceased ROM in DF of ankle joint. Tender to the PT tendon on the right just ditsal to medial malleolus.. Unable to perform single limb heel rise. Pain with inversion and PF of the foot.  Neurological: Sensation intact to light touch.   Assessment:   1. Tendonitis of ankle, right      Plan:  Patient was evaluated and treated and all questions answered. X-rays reviewed and discussed with patient.  No acute fractures or dislocations around ankle. Collapse of medial arch noted and pes planus of right foot.  Discussed PTTD diagnosis and treatment options with patient. Stretching exercises discussed and handout dispensed. Will avoid NSAIDS due to previous labs and kidney issues.   Tri-Lock ankle brace dispensed. Injection provided today. Procedure below.  Discussed if there is no improvement PT/MRI/injection may be an option. Patient to return to clinic in 6 to 8 weeks or sooner if symptoms fail to improve or worsen.   Procedure: Injection Tendon/Ligament Discussed alternatives, risks, complications and verbal consent was obtained.  Location: Right posterior tibial tendon. Skin Prep: Alcohol. Injectate: 1cc 0.5% marcaine plain, 1 cc dexamethasone.  Disposition: Patient tolerated procedure well. Injection site dressed with a band-aid.  Post-injection care was discussed and return precautions discussed.    Louann Sjogren, DPM

## 2022-07-05 ENCOUNTER — Telehealth: Payer: Self-pay | Admitting: Podiatry

## 2022-07-05 NOTE — Telephone Encounter (Signed)
Patient called stating she was here yesterday.    She noted she was supposed to get some type of program for exercising over the next 6 weeks, but didn't get it.    She is requesting the exercise program you discussed with her.

## 2022-07-06 DIAGNOSIS — I10 Essential (primary) hypertension: Secondary | ICD-10-CM | POA: Diagnosis not present

## 2022-07-08 ENCOUNTER — Encounter: Payer: Self-pay | Admitting: Podiatry

## 2022-08-02 DIAGNOSIS — Z961 Presence of intraocular lens: Secondary | ICD-10-CM | POA: Diagnosis not present

## 2022-08-02 DIAGNOSIS — E119 Type 2 diabetes mellitus without complications: Secondary | ICD-10-CM | POA: Diagnosis not present

## 2022-08-02 DIAGNOSIS — H401132 Primary open-angle glaucoma, bilateral, moderate stage: Secondary | ICD-10-CM | POA: Diagnosis not present

## 2022-08-02 DIAGNOSIS — H2512 Age-related nuclear cataract, left eye: Secondary | ICD-10-CM | POA: Diagnosis not present

## 2022-08-03 DIAGNOSIS — G8929 Other chronic pain: Secondary | ICD-10-CM | POA: Diagnosis not present

## 2022-08-03 DIAGNOSIS — N1832 Chronic kidney disease, stage 3b: Secondary | ICD-10-CM | POA: Diagnosis not present

## 2022-08-03 DIAGNOSIS — N2581 Secondary hyperparathyroidism of renal origin: Secondary | ICD-10-CM | POA: Diagnosis not present

## 2022-08-03 DIAGNOSIS — E1122 Type 2 diabetes mellitus with diabetic chronic kidney disease: Secondary | ICD-10-CM | POA: Diagnosis not present

## 2022-08-03 DIAGNOSIS — I129 Hypertensive chronic kidney disease with stage 1 through stage 4 chronic kidney disease, or unspecified chronic kidney disease: Secondary | ICD-10-CM | POA: Diagnosis not present

## 2022-08-09 ENCOUNTER — Ambulatory Visit (INDEPENDENT_AMBULATORY_CARE_PROVIDER_SITE_OTHER): Payer: Medicare Other | Admitting: Podiatry

## 2022-08-09 ENCOUNTER — Encounter: Payer: Self-pay | Admitting: Podiatry

## 2022-08-09 DIAGNOSIS — M79675 Pain in left toe(s): Secondary | ICD-10-CM

## 2022-08-09 DIAGNOSIS — E0843 Diabetes mellitus due to underlying condition with diabetic autonomic (poly)neuropathy: Secondary | ICD-10-CM

## 2022-08-09 DIAGNOSIS — B351 Tinea unguium: Secondary | ICD-10-CM | POA: Diagnosis not present

## 2022-08-09 DIAGNOSIS — M79674 Pain in right toe(s): Secondary | ICD-10-CM

## 2022-08-09 NOTE — Progress Notes (Signed)
This patient returns to my office for at risk foot care.  This patient requires this care by a professional since this patient will be at risk due to having dabetes and CKD.   This patient is unable to cut nails herself since the patient cannot reach her nails.These nails are painful walking and wearing shoes.  This patient presents for at risk foot care today.  General Appearance  Alert, conversant and in no acute stress.  Vascular  Dorsalis pedis  pulses are palpable  bilaterally. Posterior tibial pulses are absent  bilaterally. Capillary return is within normal limits  bilaterally. Temperature is within normal limits  bilaterally.  Neurologic  Senn-Weinstein monofilament wire test within normal limits  bilaterally. Muscle power within normal limits bilaterally.  Nails Thick disfigured discolored nails with subungual debris  from hallux to fifth toes bilaterally. No evidence of bacterial infection or drainage bilaterally.  Orthopedic  No limitations of motion  feet .  No crepitus or effusions noted.  No bony pathology or digital deformities noted.  HAV  B/L.  Peroneal spastic flaftoot left.    Skin  normotropic skin with no porokeratosis noted bilaterally.  No signs of infections or ulcers noted.     Onychomycosis  Pain in right toes  Pain in left toes  Consent was obtained for treatment procedures.   Mechanical debridement of nails 1-5  bilaterally performed with a nail nipper.  Filed with dremel without incident.    Return office visit   3  months                   Told patient to return for periodic foot care and evaluation due to potential at risk complications.   Amorina Doerr DPM   

## 2022-08-15 ENCOUNTER — Encounter: Payer: Self-pay | Admitting: Podiatry

## 2022-08-15 ENCOUNTER — Ambulatory Visit (INDEPENDENT_AMBULATORY_CARE_PROVIDER_SITE_OTHER): Payer: Medicare Other | Admitting: Podiatry

## 2022-08-15 DIAGNOSIS — M76821 Posterior tibial tendinitis, right leg: Secondary | ICD-10-CM | POA: Diagnosis not present

## 2022-08-15 NOTE — Patient Instructions (Signed)
Posterior Tibial Tendinitis Rehab Ask your health care provider which exercises are safe for you. Do exercises exactly as told by your provider and adjust them as directed. It's normal to feel mild stretching, pulling, tightness, or discomfort as you do these exercises. Stop right away if you feel sudden pain or your pain gets worse. Do not begin these exercises until told by your provider. Stretching and range-of-motion exercises These exercises warm up your muscles and joints and improve the movement and flexibility in your ankle and foot. These exercises may also help to relieve pain. Standing wall calf stretch, knee straight  Stand with your hands against a wall. Extend your left / right leg behind you, and bend your front knee slightly. If told, place a folded washcloth under the arch of your foot for support. Point the toes of your back foot slightly inward. Keeping your heels on the floor and your back knee straight, shift your weight toward the wall. Do not allow your back to arch. You should feel a gentle stretch in your upper left / right calf. Hold this position for __________ seconds. Repeat __________ times. Complete this exercise __________ times a day. Standing wall calf stretch, knee bent  Stand with your hands against a wall. Extend your left / right leg behind you, and bend your front knee slightly. If told, place a folded washcloth under the arch of your foot for support. Point the toes of your back foot slightly inward. Unlock your back knee so it's bent. Keep your heels on the floor. You should feel a gentle stretch deep in your lower left / right calf. Hold this position for __________ seconds. Repeat __________ times. Complete this exercise __________ times a day. Strengthening exercises These exercises build strength and endurance in your ankle and foot. Endurance is the ability to use your muscles for a long time, even after they get tired. Ankle inversion with  band  Secure one end of a rubber exercise band or tubing to a fixed object, such as a table leg or a pole, that will stay still when the band is pulled. Loop the other end of the band around the middle of your left / right foot. Sit on the floor facing the object with your left / right leg extended. The band or tube should be slightly tense when your foot is relaxed. Leading with your big toe, slowly bring your left / right foot and ankle inward, toward your other foot (inversion). Hold this position for __________ seconds. Slowly return your foot to the starting position. Repeat __________ times. Complete this exercise __________ times a day. Towel curls  Sit in a chair on a non-carpeted surface, and put your feet on the floor. Place a towel in front of your feet. If told by your provider, add a __________ weight to the end of the towel. Keeping your heel on the floor, put your left / right foot on the towel. Pull the towel toward you by grabbing the towel with your toes and curling them under. Keep your heel on the floor while you do this. Let your toes relax. Grab the towel with your toes again. Keep going until the towel is completely underneath your foot. Repeat __________ times. Complete this exercise __________ times a day. Balance exercise This exercise improves or maintains your balance. Balance is important in preventing falls. Single leg stand  Without wearing shoes, stand near a railing or in a doorway. You may hold on to the railing or   doorframe as needed for balance. Stand on your left / right foot. Keep your big toe down on the floor and try to keep your arch lifted. If balancing in this position is too easy, try the exercise with your eyes closed or while standing on a pillow. Hold this position for __________ seconds. Repeat __________ times. Complete this exercise __________ times a day. This information is not intended to replace advice given to you by your health care  provider. Make sure you discuss any questions you have with your health care provider. Document Revised: 01/19/2022 Document Reviewed: 01/19/2022 Elsevier Patient Education  2024 [employer].  

## 2022-08-15 NOTE — Progress Notes (Signed)
  Subjective:  Patient ID: [name], female    DOB: December 20, 1945,   MRN: [id number]  Chief Complaint  Patient presents with   posterior tibial tendon    F/u appt.,right foot is doing much better since purchasing a brace , otc    77 y.o. female presents for follow-up of right PTTD. Relates she is doing much better. The injection and brace has helped. She has been using an arch support brace that helps. Relates she was unable to get the exercises. Relates she is almost 100% better but not quite.  Patient is diabetic and last A1c was  Lab Results  Component Value Date   HGBA1C (H) 05/09/2008    6.5 (NOTE) The ADA recommends the following therapeutic goal for glycemic control related to Hgb A1c measurement: Goal of therapy: <6.5 Hgb A1c  Reference: American Diabetes Association: Clinical Practice Recommendations 2010, Diabetes Care, 2010, 33: (Suppl  1).   .   PCP:  Merri Brunette, MD    . Denies any other pedal complaints. Denies n/v/f/c.   Past Medical History:  Diagnosis Date   Diabetes mellitus    GERD (gastroesophageal reflux disease)    Glaucoma    Glaucoma    Hypercholesteremia    Hypertension    Obesity     Objective:  Physical Exam: Vascular: DP/PT pulses 2/4 bilateral. CFT <3 seconds. Normal hair growth on digits. No edema.  Skin. No lacerations or abrasions bilateral feet.  Musculoskeletal: MMT 5/5 bilateral lower extremities in DF, PF, Inversion and Eversion. Deceased ROM in DF of ankle joint. Mildly tender to the PT tendon on the right just ditsal to medial malleolus.. Unable to perform single limb heel rise. Mild pain with inversion and PF of the foot.  Neurological: Sensation intact to light touch.   Assessment:   1. Posterior tibial tendon dysfunction (PTTD) of right lower extremity       Plan:  Patient was evaluated and treated and all questions answered. X-rays reviewed and discussed with patient. No acute fractures or dislocations around ankle.  Collapse of medial arch noted and pes planus of right foot.  Discussed PTTD diagnosis and treatment options with patient. Continue brace and anti-inflammatories.  Will get stretching exercises printed and start those and see if that helps get to 100% Discussed if there is no improvement PT/MRI/injection may be an option. Patient to return to clinic as needed     Louann Sjogren, DPM

## 2022-08-24 DIAGNOSIS — M7061 Trochanteric bursitis, right hip: Secondary | ICD-10-CM | POA: Diagnosis not present

## 2022-08-24 DIAGNOSIS — Z0000 Encounter for general adult medical examination without abnormal findings: Secondary | ICD-10-CM | POA: Diagnosis not present

## 2022-08-24 DIAGNOSIS — I10 Essential (primary) hypertension: Secondary | ICD-10-CM | POA: Diagnosis not present

## 2022-08-24 DIAGNOSIS — Z86010 Personal history of colonic polyps: Secondary | ICD-10-CM | POA: Diagnosis not present

## 2022-08-24 DIAGNOSIS — N1832 Chronic kidney disease, stage 3b: Secondary | ICD-10-CM | POA: Diagnosis not present

## 2022-08-24 DIAGNOSIS — E1121 Type 2 diabetes mellitus with diabetic nephropathy: Secondary | ICD-10-CM | POA: Diagnosis not present

## 2022-08-24 DIAGNOSIS — I2510 Atherosclerotic heart disease of native coronary artery without angina pectoris: Secondary | ICD-10-CM | POA: Diagnosis not present

## 2022-08-24 DIAGNOSIS — K219 Gastro-esophageal reflux disease without esophagitis: Secondary | ICD-10-CM | POA: Diagnosis not present

## 2022-09-15 DIAGNOSIS — M150 Primary generalized (osteo)arthritis: Secondary | ICD-10-CM | POA: Diagnosis not present

## 2022-09-15 DIAGNOSIS — E1121 Type 2 diabetes mellitus with diabetic nephropathy: Secondary | ICD-10-CM | POA: Diagnosis not present

## 2022-09-15 DIAGNOSIS — M25551 Pain in right hip: Secondary | ICD-10-CM | POA: Diagnosis not present

## 2022-09-15 DIAGNOSIS — M7061 Trochanteric bursitis, right hip: Secondary | ICD-10-CM | POA: Diagnosis not present

## 2022-09-20 ENCOUNTER — Other Ambulatory Visit: Payer: Self-pay | Admitting: Internal Medicine

## 2022-09-20 DIAGNOSIS — I10 Essential (primary) hypertension: Secondary | ICD-10-CM

## 2022-09-28 DIAGNOSIS — E782 Mixed hyperlipidemia: Secondary | ICD-10-CM | POA: Diagnosis not present

## 2022-09-28 DIAGNOSIS — E1121 Type 2 diabetes mellitus with diabetic nephropathy: Secondary | ICD-10-CM | POA: Diagnosis not present

## 2022-09-28 DIAGNOSIS — I10 Essential (primary) hypertension: Secondary | ICD-10-CM | POA: Diagnosis not present

## 2022-10-17 DIAGNOSIS — Z86010 Personal history of colonic polyps: Secondary | ICD-10-CM | POA: Diagnosis not present

## 2022-10-17 DIAGNOSIS — K219 Gastro-esophageal reflux disease without esophagitis: Secondary | ICD-10-CM | POA: Diagnosis not present

## 2022-10-17 DIAGNOSIS — K5900 Constipation, unspecified: Secondary | ICD-10-CM | POA: Diagnosis not present

## 2022-11-09 ENCOUNTER — Ambulatory Visit: Payer: Medicare Other | Admitting: Podiatry

## 2022-11-12 ENCOUNTER — Ambulatory Visit
Admission: EM | Admit: 2022-11-12 | Discharge: 2022-11-12 | Disposition: A | Discharge status: Home / Self Care | Payer: Medicare Other | Attending: Internal Medicine | Admitting: Internal Medicine

## 2022-11-12 DIAGNOSIS — I10 Essential (primary) hypertension: Secondary | ICD-10-CM

## 2022-11-12 NOTE — ED Triage Notes (Signed)
Pt presents to check her blood pressure, as being going up for the past week. Pt reports her blood pressure meds dose was reduce due to low readings, but she started the full dose 3 days ago as her blood pressure is high. Blood pressure today was 145/103 at home.

## 2022-11-12 NOTE — ED Provider Notes (Signed)
[facility]  Note:  This document was prepared using Conservation officer, historic buildings and may include unintentional dictation errors.  MRN: [id number] DOB: 10-02-1945  Subjective:   [name] is a 77 y.o. female presenting for concerns about persistently elevated blood pressure for this past week.  She regularly monitors her blood pressure every day.  She is managed by her PCP who recommended she decrease her dosing as she was getting consistently lower readings between 100-110 systolic.  Since then she has steadily gotten more consistent readings in the 130s, 140s systolic.  Denies headache, confusion, weakness, vision changes, chest pain, diaphoresis, nausea, vomiting, abdominal pain.  Her primary goal is to avoid endorgan damage, stroke, MI, kidney injury.  She does plan on following up with her PCP.  No current facility-administered medications for this encounter.  Current Outpatient Medications:    acetaminophen (TYLENOL 8 HOUR) 650 MG CR tablet, 1 tablet, Disp: , Rfl:    AgaMatrix Ultra-Thin Lancets MISC, USE   TO CHECK GLUCOSE TWICE DAILY AS DIRECTED, Disp: , Rfl:    aspirin EC 81 MG tablet, Take 81 mg by mouth daily., Disp: , Rfl:    brimonidine-timolol (COMBIGAN) 0.2-0.5 % ophthalmic solution, Place 1 drop into both eyes every 12 (twelve) hours., Disp: , Rfl:    dapagliflozin propanediol (FARXIGA) 10 MG TABS tablet, 1 tablet, Disp: , Rfl:    esomeprazole (NEXIUM) 40 MG capsule, Take 40 mg by mouth daily as needed. Acid reflux, Disp: , Rfl:    glucose blood (ONETOUCH VERIO) test strip, USE 1 STRIP TO CHECK GLUCOSE TWICE DAILY, Disp: , Rfl:    glucose blood (ONETOUCH VERIO) test strip, USE 1 STRIP TO CHECK GLUCOSE TWICE DAILY AS DIRECTED, Disp: , Rfl:    ibuprofen (ADVIL) 800 MG tablet, Take 800 mg by mouth every 6 (six) hours as needed., Disp: , Rfl:    irbesartan (AVAPRO) 300 MG tablet, Take 1 tablet by mouth as needed., Disp: , Rfl:    latanoprost  (XALATAN) 0.005 % ophthalmic solution, 1 drop into each eye in the evening, Disp: , Rfl:    metoprolol succinate (TOPROL-XL) 50 MG 24 hr tablet, TAKE 1 TABLET BY MOUTH ONCE DAILY TAKE  WITH  OR  IMMEDIATELY  FOLLOWING  A  MEAL, Disp: 30 tablet, Rfl: 0   pregabalin (LYRICA) 50 MG capsule, Take 50 mg by mouth at bedtime., Disp: , Rfl:    rosuvastatin (CRESTOR) 40 MG tablet, Take 20 mg by mouth daily., Disp: , Rfl:    Semaglutide, 1 MG/DOSE, (OZEMPIC, 1 MG/DOSE,) 4 MG/3ML SOPN, 1 mg, Disp: , Rfl:    torsemide (DEMADEX) 20 MG tablet, Take 1 tablet (20 mg total) by mouth daily., Disp: 90 tablet, Rfl: 3   Allergies  Allergen Reactions   Methazolamide Rash   Sulfa Antibiotics Other (See Comments)    PATIENT HAS TO MONITOR THE USE OF ANY SULFA PRODUCTS.  IF IT TOUCHES HER SCALP IT CAUSES HER GREAT PAIN AND HEAT.  NOT SURE OF OTHER REACTIONS THAT SHE HAS. Severe head pain with hair products    Acetaminophen    Hydrocodone    Hydrocodone-Acetaminophen     Out of body experience   Sulfonamide Derivatives     Severe head pain with hair products   Latex Rash    Past Medical History:  Diagnosis Date   Diabetes mellitus    GERD (gastroesophageal reflux disease)    Glaucoma    Glaucoma    Hypercholesteremia  Hypertension    Obesity      Past Surgical History:  Procedure Laterality Date   ABDOMINAL HYSTERECTOMY     BREAST EXCISIONAL BIOPSY Left    benign   EYE SURGERY     TONSILLECTOMY      Family History  Problem Relation Age of Onset   Heart disease Mother    Cancer Mother    Hypertension Sister    Hypertension Sister    Heart disease Sister    Hypertension Sister    Hypertension Sister    Hypertension Brother    Hypertension Brother    Breast cancer Maternal Aunt    Cancer Other    Hyperlipidemia Other    Hypercalcemia Neg Hx     Social History   Tobacco Use   Smoking status: Never   Smokeless tobacco: Never  Vaping Use   Vaping status: Never Used  Substance Use  Topics   Alcohol use: No   Drug use: No    ROS   Objective:   Vitals: BP (!) 159/94 (BP Location: Right Arm)   Pulse 70   Temp 98.6 F (37 C) (Oral)   Resp 18   SpO2 96%   BP Readings from Last 3 Encounters:  11/12/22 (!) 159/94  06/09/22 (!) 150/86  02/18/22 (!) 156/87   Physical Exam Constitutional:      General: She is not in acute distress.    Appearance: Normal appearance. She is well-developed. She is not ill-appearing, toxic-appearing or diaphoretic.  HENT:     Head: Normocephalic and atraumatic.     Nose: Nose normal.     Mouth/Throat:     Mouth: Mucous membranes are moist.  Eyes:     General: No scleral icterus.       Right eye: No discharge.        Left eye: No discharge.     Extraocular Movements: Extraocular movements intact.  Cardiovascular:     Rate and Rhythm: Normal rate and regular rhythm.     Heart sounds: Normal heart sounds. No murmur heard.    No friction rub. No gallop.  Pulmonary:     Effort: Pulmonary effort is normal. No respiratory distress.     Breath sounds: No stridor. No wheezing, rhonchi or rales.  Chest:     Chest wall: No tenderness.  Skin:    General: Skin is warm and dry.  Neurological:     General: No focal deficit present.     Mental Status: She is alert and oriented to person, place, and time.     Cranial Nerves: No cranial nerve deficit.     Motor: No weakness.     Coordination: Coordination normal.     Gait: Gait normal.     Comments: No facial asymmetry, negative Romberg and pronator drift.  Psychiatric:        Mood and Affect: Mood normal.        Behavior: Behavior normal.     Assessment and Plan :   PDMP not reviewed this encounter.  1. Essential hypertension   2. Elevated blood pressure reading with diagnosis of hypertension    Patient presentation is not consistent with hypertensive urgency or emergency.  I did recommend that she continue to monitor her blood pressure and dose her medication  appropriately for her readings.  Reviewed monitoring parameters.  She is to follow-up with her PCP as soon as possible.  Counseled patient on potential for adverse effects with medications prescribed/recommended today, ER and  return-to-clinic precautions discussed, patient verbalized understanding.    Wallis Bamberg, PA-C 11/12/22 1400

## 2022-11-12 NOTE — Discharge Instructions (Signed)
Good blood pressure readings should be between 110-130 systolic over 70-85 diastolic. If you fall below those numbers without medication then do not take it. If you fall above those numbers then take the blood pressure medicine.  For diabetes or elevated blood sugar, please make sure you are limiting and avoiding starchy, carbohydrate foods like pasta, breads, sweet breads, pastry, rice, potatoes, desserts. These foods can elevate your blood sugar. Also, limit and avoid drinks that contain a lot of sugar such as sodas, sweet teas, fruit juices.  Drinking plain water will be much more helpful, try 64 ounces of water daily.  It is okay to flavor your water naturally by cutting cucumber, lemon, mint or lime, placing it in a picture with water and drinking it over a period of 24-48 hours as long as it remains refrigerated.  For elevated blood pressure, make sure you are monitoring salt in your diet.  Do not eat restaurant foods and limit processed foods at home. I highly recommend you prepare and cook your own foods at home.  Processed foods include things like frozen meals, pre-seasoned meats and dinners, deli meats, canned foods as these foods contain a high amount of sodium/salt.  Make sure you are paying attention to sodium labels on foods you buy at the grocery store. Buy your spices separately such as garlic powder, onion powder, cumin, cayenne, parsley flakes so that you can avoid seasonings that contain salt. However, salt-free seasonings are available and can be used, an example is Mrs. Dash and includes a lot of different mixtures that do not contain salt.  Lastly, when cooking using oils that are healthier for you is important. This includes olive oil, avocado oil, canola oil. We have discussed a lot of foods to avoid but below is a list of foods that can be very healthy to use in your diet whether it is for diabetes, cholesterol, high blood pressure, or in general healthy eating.  Salads - kale,  spinach, cabbage, spring mix, arugula Fruits - avocadoes, berries (blueberries, raspberries, blackberries), apples, oranges, pomegranate, grapefruit, kiwi Vegetables - asparagus, cauliflower, broccoli, green beans, brussel sprouts, bell peppers, beets; stay away from or limit starchy vegetables like potatoes, carrots, peas Other general foods - kidney beans, egg whites, almonds, walnuts, sunflower seeds, pumpkin seeds, fat free yogurt, almond milk, flax seeds, quinoa, oats  Meat - It is better to eat lean meats and limit your red meat including pork to once a week.  Wild caught fish, chicken breast are good options as they tend to be leaner sources of good protein. Still be mindful of the sodium labels for the meats you buy.  DO NOT EAT ANY FOODS ON THIS LIST THAT YOU ARE ALLERGIC TO. For more specific needs, I highly recommend consulting a dietician or nutritionist but this can definitely be a good starting point.

## 2022-11-22 DIAGNOSIS — H401132 Primary open-angle glaucoma, bilateral, moderate stage: Secondary | ICD-10-CM | POA: Diagnosis not present

## 2022-11-22 DIAGNOSIS — E119 Type 2 diabetes mellitus without complications: Secondary | ICD-10-CM | POA: Diagnosis not present

## 2022-11-22 DIAGNOSIS — Z961 Presence of intraocular lens: Secondary | ICD-10-CM | POA: Diagnosis not present

## 2022-11-24 ENCOUNTER — Encounter: Payer: Self-pay | Admitting: Podiatry

## 2022-11-24 ENCOUNTER — Ambulatory Visit (INDEPENDENT_AMBULATORY_CARE_PROVIDER_SITE_OTHER): Payer: Medicare Other | Admitting: Podiatry

## 2022-11-24 VITALS — Ht 63.0 in | Wt 212.0 lb

## 2022-11-24 DIAGNOSIS — M79674 Pain in right toe(s): Secondary | ICD-10-CM | POA: Diagnosis not present

## 2022-11-24 DIAGNOSIS — M79675 Pain in left toe(s): Secondary | ICD-10-CM | POA: Diagnosis not present

## 2022-11-24 DIAGNOSIS — B351 Tinea unguium: Secondary | ICD-10-CM | POA: Diagnosis not present

## 2022-11-24 DIAGNOSIS — E0843 Diabetes mellitus due to underlying condition with diabetic autonomic (poly)neuropathy: Secondary | ICD-10-CM | POA: Diagnosis not present

## 2022-11-24 NOTE — Progress Notes (Signed)
This patient returns to my office for at risk foot care.  This patient requires this care by a professional since this patient will be at risk due to having dabetes and CKD.   This patient is unable to cut nails herself since the patient cannot reach her nails.These nails are painful walking and wearing shoes.  This patient presents for at risk foot care today.  General Appearance  Alert, conversant and in no acute stress.  Vascular  Dorsalis pedis  pulses are palpable  bilaterally. Posterior tibial pulses are absent  bilaterally. Capillary return is within normal limits  bilaterally. Temperature is within normal limits  bilaterally.  Neurologic  Senn-Weinstein monofilament wire test within normal limits  bilaterally. Muscle power within normal limits bilaterally.  Nails Thick disfigured discolored nails with subungual debris  from hallux to fifth toes bilaterally. No evidence of bacterial infection or drainage bilaterally.  Orthopedic  No limitations of motion  feet .  No crepitus or effusions noted.  No bony pathology or digital deformities noted.  HAV  B/L.  Peroneal spastic flaftoot left.    Skin  normotropic skin with no porokeratosis noted bilaterally.  No signs of infections or ulcers noted.     Onychomycosis  Pain in right toes  Pain in left toes  Consent was obtained for treatment procedures.   Mechanical debridement of nails 1-5  bilaterally performed with a nail nipper.  Filed with dremel without incident.    Return office visit   3  months                   Told patient to return for periodic foot care and evaluation due to potential at risk complications.   Amorina Doerr DPM   

## 2022-11-30 DIAGNOSIS — E782 Mixed hyperlipidemia: Secondary | ICD-10-CM | POA: Diagnosis not present

## 2022-11-30 DIAGNOSIS — I10 Essential (primary) hypertension: Secondary | ICD-10-CM | POA: Diagnosis not present

## 2022-11-30 DIAGNOSIS — E1121 Type 2 diabetes mellitus with diabetic nephropathy: Secondary | ICD-10-CM | POA: Diagnosis not present

## 2022-12-08 DIAGNOSIS — D123 Benign neoplasm of transverse colon: Secondary | ICD-10-CM | POA: Diagnosis not present

## 2022-12-08 DIAGNOSIS — Z860101 Personal history of adenomatous and serrated colon polyps: Secondary | ICD-10-CM | POA: Diagnosis not present

## 2022-12-08 DIAGNOSIS — D122 Benign neoplasm of ascending colon: Secondary | ICD-10-CM | POA: Diagnosis not present

## 2022-12-08 DIAGNOSIS — Z860100 Personal history of colon polyps, unspecified: Secondary | ICD-10-CM | POA: Diagnosis not present

## 2022-12-08 DIAGNOSIS — K635 Polyp of colon: Secondary | ICD-10-CM | POA: Diagnosis not present

## 2022-12-08 DIAGNOSIS — Z1211 Encounter for screening for malignant neoplasm of colon: Secondary | ICD-10-CM | POA: Diagnosis not present

## 2022-12-08 DIAGNOSIS — K5730 Diverticulosis of large intestine without perforation or abscess without bleeding: Secondary | ICD-10-CM | POA: Diagnosis not present

## 2022-12-12 ENCOUNTER — Ambulatory Visit: Payer: Self-pay | Admitting: Cardiology

## 2022-12-28 ENCOUNTER — Encounter: Payer: Self-pay | Admitting: Cardiology

## 2022-12-28 ENCOUNTER — Ambulatory Visit: Payer: Medicare Other | Attending: Cardiology | Admitting: Cardiology

## 2022-12-28 VITALS — BP 140/82 | HR 79 | Ht 63.0 in | Wt 217.0 lb

## 2022-12-28 DIAGNOSIS — I10 Essential (primary) hypertension: Secondary | ICD-10-CM

## 2022-12-28 DIAGNOSIS — I2510 Atherosclerotic heart disease of native coronary artery without angina pectoris: Secondary | ICD-10-CM | POA: Diagnosis not present

## 2022-12-28 DIAGNOSIS — E118 Type 2 diabetes mellitus with unspecified complications: Secondary | ICD-10-CM

## 2022-12-28 DIAGNOSIS — I2584 Coronary atherosclerosis due to calcified coronary lesion: Secondary | ICD-10-CM | POA: Diagnosis not present

## 2022-12-28 DIAGNOSIS — E7800 Pure hypercholesterolemia, unspecified: Secondary | ICD-10-CM

## 2022-12-28 MED ORDER — TORSEMIDE 10 MG PO TABS: 10.0000 mg | ORAL_TABLET | Freq: Every morning | ORAL | 3 refills | Status: DC

## 2022-12-28 MED ORDER — TORSEMIDE 20 MG PO TABS: 20.0000 mg | ORAL_TABLET | Freq: Every morning | ORAL | 3 refills | Status: DC

## 2022-12-28 NOTE — Patient Instructions (Addendum)
Medication Instructions:  Your physician has recommended you make the following change in your medication:   STOP Metoprolol Succinate (Toprol-XL)  *Make sure to take Irbesartan (Avapro) and Torsemide in the morning  *Make sure to take Farxiga in the evening    *If you need a refill on your cardiac medications before your next appointment, please call your pharmacy*  Lab Work: None ordered today. If you have labs (blood work) drawn today and your tests are completely normal, you will receive your results only by: MyChart Message (if you have MyChart) OR A paper copy in the mail If you have any lab test that is abnormal or we need to change your treatment, we will call you to review the results.  Testing/Procedures: None ordered today.  Follow-Up: At [facility], you and your health needs are our priority.  As part of our continuing mission to provide you with exceptional heart care, we have created designated Provider Care Teams.  These Care Teams include your primary Cardiologist (physician) and Advanced Practice Providers (APPs -  Physician Assistants and Nurse Practitioners) who all work together to provide you with the care you need, when you need it.  Your next appointment:   3 month(s)  The format for your next appointment:   In Person  Provider:   Tessa Lerner, DO {

## 2022-12-28 NOTE — Progress Notes (Signed)
Cardiology Office Note:  .   Date:  12/28/2022  ID:  [name], DOB [birth year], MRN [id number] PCP:  Merri Brunette, MD  Former Cardiology Providers: Dr. Melton Alar.  [facility] Providers Cardiologist:  Tessa Lerner, DO , [facility] (established care 12/28/22) Electrophysiologist:  None  Click to update primary MD,subspecialty MD or APP then REFRESH:1}    Chief Complaint  Patient presents with   Follow-up    CAC     History of Present Illness: .   [name] is a 77 y.o. African-American female whose past medical history and cardiovascular risk factors includes: Hypertension w/ CKD, NIDDM, severe coronary artery calcification, obesity.   Formally under the care of Dr. Melton Alar who last saw [name] back in May 2024. I am seeing her for the first time to re-establishing care.   Patient presents today for 83-month follow-up visit.  Over the last 6 months she denies any anginal chest pain or heart failure symptoms.  However, she is overall concerned as she has many cardiovascular comorbidities and in the past was noted to have severe CAC.  Outside labs independently reviewed from [facility] database-as of October 2024 LDL 56 mg/dL.  A1c 6.7 as of May 2024.  Office and home blood pressures are not well-controlled.   Currently she takes Avapro either 75 mg 150 mg at night.  Farxiga 10 mg p.o. every afternoon.  Torsemide 20 mg p.o. every morning.  And Toprol-XL 50 mg on a as needed basis.  Patient states that she does follow-up with nephrology given her chronic kidney disease and that she has " 1 kidney."  She denies any prior history of nephrectomy.  Review of Systems: .   Review of Systems  Cardiovascular:  Negative for chest pain, claudication, irregular heartbeat, leg swelling, near-syncope, orthopnea, palpitations, paroxysmal nocturnal dyspnea and syncope.  Respiratory:  Negative for shortness of breath.   Hematologic/Lymphatic: Negative for bleeding problem.     Studies Reviewed:   EKG: EKG Interpretation Date/Time:  Wednesday December 28 2022 09:29:08 EST Ventricular Rate:  79 PR Interval:  152 QRS Duration:  74 QT Interval:  370 QTC Calculation: 424 R Axis:   34  Text Interpretation: Normal sinus rhythm Cannot rule out Anterior infarct , age undetermined When compared with ECG of 01-Jun-2013 19:27, No significant change was found Confirmed by Tessa Lerner [phone]) on 12/28/2022 9:38:10 AM  Echocardiogram: 10/05/2021: Normal LV systolic function with visual EF 55-60%. Left ventricle cavity is normal in size. Moderate concentric hypertrophy of the left ventricle. Normal global wall motion. Doppler evidence of grade I (impaired) diastolic dysfunction, normal LAP. Calculated EF 57%. Left atrial cavity is mildly dilated. Trileaflet aortic valve.  Trace aortic regurgitation. Mild aortic valve leaflet calcification. Structurally normal mitral valve.  Mild (Grade I) mitral regurgitation. Structurally normal tricuspid valve.  Mild tricuspid regurgitation. No evidence of pulmonary hypertension. No prior available for comparison.   Stress Testing: Exercise nuclear stress test 09/29/21 Myocardial perfusion is normal. Low risk study. See report for additional details.  CCS 07/2021 IMPRESSION: 1. Coronary calcium score of 2409. This was 99th percentile for age-, race-, and sex-matched controls. 2. Aortic atherosclerosis and aortic valve calcification. 3. Borderline dilated aorta at 37 mm (non-contrast, level of the main PA bifurcation).  RADIOLOGY: NA  Risk Assessment/Calculations:   NA   Labs:       Latest Ref Rng & Units 03/08/2015    9:55 AM 06/01/2013    7:40 PM 04/02/2013   11:15 AM  CBC  WBC 4.0 - 10.5 K/uL 5.1  6.5  6.5   Hemoglobin 12.0 - 15.0 g/dL 56.2  13.0  86.5   Hematocrit 36.0 - 46.0 % 39.8  37.3  39.4   Platelets 150 - 400 K/uL 191  230  205        Latest Ref Rng & Units 04/02/2020    3:24 PM 03/08/2015    9:55 AM 06/01/2013     7:40 PM  BMP  Glucose 70 - 99 mg/dL 784  696  295   BUN 6 - 23 mg/dL 24  30  19    Creatinine 0.40 - 1.20 mg/dL 2.84  1.32  4.40   Sodium 135 - 145 mEq/L 140  139  139   Potassium 3.5 - 5.1 mEq/L 3.7  4.6  4.3   Chloride 96 - 112 mEq/L 103  105  102   CO2 19 - 32 mEq/L 28  26  23    Calcium 8.4 - 10.5 mg/dL 8.6 - 10.2 mg/dL 72.5    36.6  9.9  44.0       Latest Ref Rng & Units 04/02/2020    3:24 PM 03/08/2015    9:55 AM 06/01/2013    7:40 PM  CMP  Glucose 70 - 99 mg/dL 347  425  956   BUN 6 - 23 mg/dL 24  30  19    Creatinine 0.40 - 1.20 mg/dL 3.87  5.64  3.32   Sodium 135 - 145 mEq/L 140  139  139   Potassium 3.5 - 5.1 mEq/L 3.7  4.6  4.3   Chloride 96 - 112 mEq/L 103  105  102   CO2 19 - 32 mEq/L 28  26  23    Calcium 8.4 - 10.5 mg/dL 8.6 - 95.1 mg/dL 88.4    16.6  9.9  06.3   Total Protein 6.0 - 8.3 g/dL   7.7   Total Bilirubin 0.3 - 1.2 mg/dL   0.3   Alkaline Phos 39 - 117 U/L   107   AST 0 - 37 U/L   19   ALT 0 - 35 U/L   17     No results found for: "CHOL", "HDL", "LDLCALC", "LDLDIRECT", "TRIG", "CHOLHDL" No results for input(s): "LIPOA" in the last 8760 hours. No components found for: "NTPROBNP" No results for input(s): "PROBNP" in the last 8760 hours. No results for input(s): "TSH" in the last 8760 hours.  [facility] database October 2024: Total cholesterol 146, triglycerides 110, HDL 70, LDL 56  May 2024: K1S 6.7  Physical Exam:    Today's Vitals   12/28/22 0925  BP: (!) 140/82  Pulse: 79  SpO2: 96%  Weight: 217 lb (98.4 kg)  Height: 5\' 3"  (1.6 m)   Body mass index is 38.44 kg/m. Wt Readings from Last 3 Encounters:  12/28/22 217 lb (98.4 kg)  11/24/22 212 lb (96.2 kg)  06/09/22 212 lb (96.2 kg)    Physical Exam  Constitutional: No distress.  hemodynamically stable  Neck: No JVD present.  Cardiovascular: Normal rate, regular rhythm, S1 normal and S2 normal. Exam reveals no gallop, no S3 and no S4.  No murmur heard. Pulmonary/Chest: Effort normal and  breath sounds normal. No stridor. She has no wheezes. She has no rales.  Abdominal: Soft. Bowel sounds are normal. She exhibits no distension. There is no abdominal tenderness.  Musculoskeletal:        General: Edema (Trace bilaterally) present.     Cervical back:  Neck supple.  Neurological: She is alert and oriented to person, place, and time. She has intact cranial nerves (2-12).  Skin: Skin is warm.   Impression & Recommendation(s):  Impression:   ICD-10-CM   1. Coronary atherosclerosis due to calcified coronary lesion  I25.10 EKG 12-Lead   I25.84     2. Essential hypertension  I10     3. Type 2 diabetes mellitus with complication, without long-term current use of insulin (HCC)  E11.8     4. Pure hypercholesterolemia  E78.00        Recommendation(s):  Coronary atherosclerosis due to calcified coronary lesion Total coronary calcium score 2409, 99th percentile. Currently on aspirin and statin therapy.   Prior exercise nuclear stress test reported to be low risk. EKG is nonischemic. Currently on Ozempic-recommended up titration.  Patient will discuss with PCP Reemphasized the importance of secondary prevention with focus on improving her modifiable cardiovascular risk factors such as glycemic control, lipid management, blood pressure control, weight loss.  Essential hypertension Office blood pressures are acceptable but not at goal. Home blood pressure regimen is not predictable. Discontinue Toprol-XL. Reduce torsemide from 20 mg p.o. daily to 10 mg p.o. every morning. Take Avapro 150 mg p.o. every morning. Take Farxiga 10 mg p.o. every afternoon, as currently taking Goal SBP 130 mmHg She does have trace bilateral pitting edema, strongly recommended the use of compression stockings for at least 8 hours a day and to remove them at night when she goes to sleep   Type 2 diabetes mellitus with complication, without long-term current use of insulin (HCC) Hemoglobin A1c  well-controlled as of May 2024. Currently on SGLT2 inhibitors, ARB, statin therapy, Ozempic  Pure hypercholesterolemia Currently on rosuvastatin.   She denies myalgia or other side effects. Most recent lipids dated October 2024, independently reviewed as noted above.  LDL 56 mg/dL Currently managed by primary care provider.  Orders Placed:  Orders Placed This Encounter  Procedures   EKG 12-Lead   Final Medication List:    Meds ordered this encounter  Medications   DISCONTD: torsemide (DEMADEX) 20 MG tablet    Sig: Take 1 tablet (20 mg total) by mouth in the morning.    Dispense:  90 tablet    Refill:  3   torsemide (DEMADEX) 10 MG tablet    Sig: Take 1 tablet (10 mg total) by mouth in the morning.    Dispense:  90 tablet    Refill:  3    Decreased strength to 10 mg    Medications Discontinued During This Encounter  Medication Reason   torsemide (DEMADEX) 20 MG tablet Reorder   metoprolol succinate (TOPROL-XL) 50 MG 24 hr tablet Discontinued by provider   torsemide (DEMADEX) 20 MG tablet      Current Outpatient Medications:    acetaminophen (TYLENOL 8 HOUR) 650 MG CR tablet, 1 tablet, Disp: , Rfl:    AgaMatrix Ultra-Thin Lancets MISC, USE   TO CHECK GLUCOSE TWICE DAILY AS DIRECTED, Disp: , Rfl:    aspirin EC 81 MG tablet, Take 81 mg by mouth daily., Disp: , Rfl:    brimonidine-timolol (COMBIGAN) 0.2-0.5 % ophthalmic solution, Place 1 drop into both eyes every 12 (twelve) hours., Disp: , Rfl:    dapagliflozin propanediol (FARXIGA) 10 MG TABS tablet, Take 10 mg by mouth every evening., Disp: , Rfl:    esomeprazole (NEXIUM) 40 MG capsule, Take 40 mg by mouth daily as needed. Acid reflux, Disp: , Rfl:    glucose blood (  ONETOUCH VERIO) test strip, USE 1 STRIP TO CHECK GLUCOSE TWICE DAILY, Disp: , Rfl:    glucose blood (ONETOUCH VERIO) test strip, USE 1 STRIP TO CHECK GLUCOSE TWICE DAILY AS DIRECTED, Disp: , Rfl:    ibuprofen (ADVIL) 800 MG tablet, Take 800 mg by mouth every 6  (six) hours as needed., Disp: , Rfl:    irbesartan (AVAPRO) 150 MG tablet, Take 150 mg by mouth in the morning., Disp: , Rfl:    latanoprost (XALATAN) 0.005 % ophthalmic solution, 1 drop into each eye in the evening, Disp: , Rfl:    pregabalin (LYRICA) 50 MG capsule, Take 50 mg by mouth at bedtime., Disp: , Rfl:    rosuvastatin (CRESTOR) 40 MG tablet, Take 20 mg by mouth daily., Disp: , Rfl:    Semaglutide, 1 MG/DOSE, (OZEMPIC, 1 MG/DOSE,) 4 MG/3ML SOPN, 1 mg, Disp: , Rfl:    torsemide (DEMADEX) 10 MG tablet, Take 1 tablet (10 mg total) by mouth in the morning., Disp: 90 tablet, Rfl: 3  Consent:   N/A  Disposition:   3 months sooner if needed Patient may be asked to follow-up sooner based on the results of the above-mentioned testing.  Her questions and concerns were addressed to her satisfaction. She voices understanding of the recommendations provided during this encounter.    Signed, Tessa Lerner, DO, [facility]  [facility]  [address] [place], [place] [address] 12/28/2022 3:57 PM

## 2023-02-06 DIAGNOSIS — E1129 Type 2 diabetes mellitus with other diabetic kidney complication: Secondary | ICD-10-CM | POA: Diagnosis not present

## 2023-02-06 DIAGNOSIS — I129 Hypertensive chronic kidney disease with stage 1 through stage 4 chronic kidney disease, or unspecified chronic kidney disease: Secondary | ICD-10-CM | POA: Diagnosis not present

## 2023-02-06 DIAGNOSIS — E1122 Type 2 diabetes mellitus with diabetic chronic kidney disease: Secondary | ICD-10-CM | POA: Diagnosis not present

## 2023-02-06 DIAGNOSIS — N1832 Chronic kidney disease, stage 3b: Secondary | ICD-10-CM | POA: Diagnosis not present

## 2023-02-06 DIAGNOSIS — E785 Hyperlipidemia, unspecified: Secondary | ICD-10-CM | POA: Diagnosis not present

## 2023-02-06 DIAGNOSIS — I701 Atherosclerosis of renal artery: Secondary | ICD-10-CM | POA: Diagnosis not present

## 2023-02-06 DIAGNOSIS — N2581 Secondary hyperparathyroidism of renal origin: Secondary | ICD-10-CM | POA: Diagnosis not present

## 2023-02-21 DIAGNOSIS — R059 Cough, unspecified: Secondary | ICD-10-CM | POA: Diagnosis not present

## 2023-02-21 DIAGNOSIS — J069 Acute upper respiratory infection, unspecified: Secondary | ICD-10-CM | POA: Diagnosis not present

## 2023-02-28 DIAGNOSIS — H401132 Primary open-angle glaucoma, bilateral, moderate stage: Secondary | ICD-10-CM | POA: Diagnosis not present

## 2023-03-02 ENCOUNTER — Ambulatory Visit (INDEPENDENT_AMBULATORY_CARE_PROVIDER_SITE_OTHER): Payer: Medicare Other | Admitting: Podiatry

## 2023-03-02 ENCOUNTER — Encounter: Payer: Self-pay | Admitting: Podiatry

## 2023-03-02 DIAGNOSIS — I10 Essential (primary) hypertension: Secondary | ICD-10-CM | POA: Diagnosis not present

## 2023-03-02 DIAGNOSIS — B351 Tinea unguium: Secondary | ICD-10-CM

## 2023-03-02 DIAGNOSIS — M79674 Pain in right toe(s): Secondary | ICD-10-CM | POA: Diagnosis not present

## 2023-03-02 DIAGNOSIS — E1121 Type 2 diabetes mellitus with diabetic nephropathy: Secondary | ICD-10-CM | POA: Diagnosis not present

## 2023-03-02 DIAGNOSIS — E0843 Diabetes mellitus due to underlying condition with diabetic autonomic (poly)neuropathy: Secondary | ICD-10-CM

## 2023-03-02 DIAGNOSIS — M79675 Pain in left toe(s): Secondary | ICD-10-CM

## 2023-03-02 DIAGNOSIS — E782 Mixed hyperlipidemia: Secondary | ICD-10-CM | POA: Diagnosis not present

## 2023-03-02 NOTE — Progress Notes (Signed)
This patient returns to my office for at risk foot care.  This patient requires this care by a professional since this patient will be at risk due to having dabetes and CKD.   This patient is unable to cut nails herself since the patient cannot reach her nails.These nails are painful walking and wearing shoes.  This patient presents for at risk foot care today.  General Appearance  Alert, conversant and in no acute stress.  Vascular  Dorsalis pedis  pulses are palpable  bilaterally. Posterior tibial pulses are absent  bilaterally. Capillary return is within normal limits  bilaterally. Temperature is within normal limits  bilaterally.  Neurologic  Senn-Weinstein monofilament wire test within normal limits  bilaterally. Muscle power within normal limits bilaterally.  Nails Thick disfigured discolored nails with subungual debris  from hallux to fifth toes bilaterally. No evidence of bacterial infection or drainage bilaterally.  Orthopedic  No limitations of motion  feet .  No crepitus or effusions noted.  No bony pathology or digital deformities noted.  HAV  B/L.  Peroneal spastic flaftoot left.    Skin  normotropic skin with no porokeratosis noted bilaterally.  No signs of infections or ulcers noted.     Onychomycosis  Pain in right toes  Pain in left toes  Consent was obtained for treatment procedures.   Mechanical debridement of nails 1-5  bilaterally performed with a nail nipper.  Filed with dremel without incident.    Return office visit   3  months                   Told patient to return for periodic foot care and evaluation due to potential at risk complications.   Helane Gunther DPM

## 2023-03-09 DIAGNOSIS — E1121 Type 2 diabetes mellitus with diabetic nephropathy: Secondary | ICD-10-CM | POA: Diagnosis not present

## 2023-03-09 DIAGNOSIS — I10 Essential (primary) hypertension: Secondary | ICD-10-CM | POA: Diagnosis not present

## 2023-03-09 DIAGNOSIS — E782 Mixed hyperlipidemia: Secondary | ICD-10-CM | POA: Diagnosis not present

## 2023-03-09 DIAGNOSIS — E213 Hyperparathyroidism, unspecified: Secondary | ICD-10-CM | POA: Diagnosis not present

## 2023-03-09 DIAGNOSIS — M25551 Pain in right hip: Secondary | ICD-10-CM | POA: Diagnosis not present

## 2023-03-09 DIAGNOSIS — I2510 Atherosclerotic heart disease of native coronary artery without angina pectoris: Secondary | ICD-10-CM | POA: Diagnosis not present

## 2023-03-10 DIAGNOSIS — M150 Primary generalized (osteo)arthritis: Secondary | ICD-10-CM | POA: Diagnosis not present

## 2023-03-10 DIAGNOSIS — M7061 Trochanteric bursitis, right hip: Secondary | ICD-10-CM | POA: Diagnosis not present

## 2023-03-10 DIAGNOSIS — N1831 Chronic kidney disease, stage 3a: Secondary | ICD-10-CM | POA: Diagnosis not present

## 2023-03-10 DIAGNOSIS — M25551 Pain in right hip: Secondary | ICD-10-CM | POA: Diagnosis not present

## 2023-03-10 DIAGNOSIS — E1121 Type 2 diabetes mellitus with diabetic nephropathy: Secondary | ICD-10-CM | POA: Diagnosis not present

## 2023-03-21 DIAGNOSIS — N1831 Chronic kidney disease, stage 3a: Secondary | ICD-10-CM | POA: Diagnosis not present

## 2023-03-21 DIAGNOSIS — I10 Essential (primary) hypertension: Secondary | ICD-10-CM | POA: Diagnosis not present

## 2023-03-21 DIAGNOSIS — R509 Fever, unspecified: Secondary | ICD-10-CM | POA: Diagnosis not present

## 2023-03-21 DIAGNOSIS — R42 Dizziness and giddiness: Secondary | ICD-10-CM | POA: Diagnosis not present

## 2023-03-24 DIAGNOSIS — R42 Dizziness and giddiness: Secondary | ICD-10-CM | POA: Diagnosis not present

## 2023-03-24 DIAGNOSIS — N184 Chronic kidney disease, stage 4 (severe): Secondary | ICD-10-CM | POA: Diagnosis not present

## 2023-03-24 DIAGNOSIS — I10 Essential (primary) hypertension: Secondary | ICD-10-CM | POA: Diagnosis not present

## 2023-04-04 DIAGNOSIS — E1121 Type 2 diabetes mellitus with diabetic nephropathy: Secondary | ICD-10-CM | POA: Diagnosis not present

## 2023-04-04 DIAGNOSIS — E782 Mixed hyperlipidemia: Secondary | ICD-10-CM | POA: Diagnosis not present

## 2023-04-04 DIAGNOSIS — I10 Essential (primary) hypertension: Secondary | ICD-10-CM | POA: Diagnosis not present

## 2023-04-04 DIAGNOSIS — E213 Hyperparathyroidism, unspecified: Secondary | ICD-10-CM | POA: Diagnosis not present

## 2023-04-04 DIAGNOSIS — I2510 Atherosclerotic heart disease of native coronary artery without angina pectoris: Secondary | ICD-10-CM | POA: Diagnosis not present

## 2023-04-12 ENCOUNTER — Encounter: Payer: Self-pay | Admitting: Cardiology

## 2023-04-12 ENCOUNTER — Ambulatory Visit: Payer: Medicare Other | Attending: Cardiology | Admitting: Cardiology

## 2023-04-12 VITALS — BP 142/82 | HR 90 | Ht 63.0 in | Wt 219.8 lb

## 2023-04-12 DIAGNOSIS — I2510 Atherosclerotic heart disease of native coronary artery without angina pectoris: Secondary | ICD-10-CM

## 2023-04-12 DIAGNOSIS — I2584 Coronary atherosclerosis due to calcified coronary lesion: Secondary | ICD-10-CM

## 2023-04-12 DIAGNOSIS — I10 Essential (primary) hypertension: Secondary | ICD-10-CM | POA: Diagnosis not present

## 2023-04-12 DIAGNOSIS — E7800 Pure hypercholesterolemia, unspecified: Secondary | ICD-10-CM

## 2023-04-12 DIAGNOSIS — I25810 Atherosclerosis of coronary artery bypass graft(s) without angina pectoris: Secondary | ICD-10-CM | POA: Diagnosis not present

## 2023-04-12 DIAGNOSIS — E118 Type 2 diabetes mellitus with unspecified complications: Secondary | ICD-10-CM | POA: Diagnosis not present

## 2023-04-12 MED ORDER — EZETIMIBE 10 MG PO TABS: 10.0000 mg | ORAL_TABLET | Freq: Every day | ORAL | 3 refills | Status: DC

## 2023-04-12 MED ORDER — TORSEMIDE 10 MG PO TABS: 10.0000 mg | ORAL_TABLET | ORAL | 1 refills | Status: AC

## 2023-04-12 NOTE — Patient Instructions (Addendum)
 Medication Instructions:  Your physician has recommended you make the following change in your medication:   START Ezetimibe (Zetia) 10 mg once daily  CHANGE how you take Torsemide- take once every other day   *If you need a refill on your cardiac medications before your next appointment, please call your pharmacy*  Lab Work: To be completed in 6 weeks: FASTING lipid panel, direct LDL, CMP (approximately 05/24/23)  If you have labs (blood work) drawn today and your tests are completely normal, you will receive your results only by: MyChart Message (if you have MyChart) OR A paper copy in the mail If you have any lab test that is abnormal or we need to change your treatment, we will call you to review the results.  Testing/Procedures: None ordered today.  Follow-Up: At [facility], you and your health needs are our priority.  As part of our continuing mission to provide you with exceptional heart care, we have created designated Provider Care Teams.  These Care Teams include your primary Cardiologist (physician) and Advanced Practice Providers (APPs -  Physician Assistants and Nurse Practitioners) who all work together to provide you with the care you need, when you need it.  We recommend signing up for the patient portal called "MyChart".  Sign up information is provided on this After Visit Summary.  MyChart is used to connect with patients for Virtual Visits (Telemedicine).  Patients are able to view lab/test results, encounter notes, upcoming appointments, etc.  Non-urgent messages can be sent to your provider as well.   To learn more about what you can do with MyChart, go to [email or web address].    Your next appointment:   1 year(s)  The format for your next appointment:   In Person  Provider:   Tessa Lerner, DO {

## 2023-04-12 NOTE — Progress Notes (Signed)
 Cardiology Office Note:  .   Date:  04/12/2023  ID:  [name], DOB January 04, 1946, MRN [id number] PCP:  Merri Brunette, MD  Former Cardiology Providers: Dr. Melton Alar.  [facility] Providers Cardiologist:  Tessa Lerner, DO , [facility] (established care 12/28/22) Electrophysiologist:  None  Click to update primary MD,subspecialty MD or APP then REFRESH:1}    No chief complaint on file.   History of Present Illness: .   [name] is a 78 y.o. African-American female whose past medical history and cardiovascular risk factors includes: Hypertension w/ CKD, NIDDM, severe coronary artery calcification, obesity.   Patient is being followed at the practice given her history of severe coronary artery calcification with a total score of 2409 placing her at the 99th percentile.  She is currently on aspirin and statin therapy and working on improving her modifiable cardiovascular risk factors.  She has undergone ischemic workup as outlined below.  She presents today for follow-up.  Patient is accompanied by her husband at today's visit.  Denies anginal chest pain or heart failure symptoms.  She had 1 episode since last office visit likely secondary to dehydration which she did not feel well.  Home blood pressures are well-controlled according to the husband.  Blood pressure log provided and reviewed and updated under media section.  Review of Systems: .   Review of Systems  Cardiovascular:  Negative for chest pain, claudication, irregular heartbeat, leg swelling, near-syncope, orthopnea, palpitations, paroxysmal nocturnal dyspnea and syncope.  Respiratory:  Negative for shortness of breath.   Hematologic/Lymphatic: Negative for bleeding problem.    Studies Reviewed:    Echocardiogram: 10/05/2021: Normal LV systolic function with visual EF 55-60%. Left ventricle cavity is normal in size. Moderate concentric hypertrophy of the left ventricle. Normal global wall motion. Doppler  evidence of grade I (impaired) diastolic dysfunction, normal LAP. Calculated EF 57%. Left atrial cavity is mildly dilated. Trileaflet aortic valve.  Trace aortic regurgitation. Mild aortic valve leaflet calcification. Structurally normal mitral valve.  Mild (Grade I) mitral regurgitation. Structurally normal tricuspid valve.  Mild tricuspid regurgitation. No evidence of pulmonary hypertension. No prior available for comparison.   Stress Testing: Exercise nuclear stress test 09/29/21 Myocardial perfusion is normal. Low risk study. See report for additional details.  CCS 07/2021 IMPRESSION: 1. Coronary calcium score of 2409. This was 99th percentile for age-, race-, and sex-matched controls. 2. Aortic atherosclerosis and aortic valve calcification. 3. Borderline dilated aorta at 37 mm (non-contrast, level of the main PA bifurcation).  RADIOLOGY: NA  Risk Assessment/Calculations:   NA   Labs:       Latest Ref Rng & Units 03/08/2015    9:55 AM 06/01/2013    7:40 PM 04/02/2013   11:15 AM  CBC  WBC 4.0 - 10.5 K/uL 5.1  6.5  6.5   Hemoglobin 12.0 - 15.0 g/dL 40.9  81.1  91.4   Hematocrit 36.0 - 46.0 % 39.8  37.3  39.4   Platelets 150 - 400 K/uL 191  230  205        Latest Ref Rng & Units 04/02/2020    3:24 PM 03/08/2015    9:55 AM 06/01/2013    7:40 PM  BMP  Glucose 70 - 99 mg/dL 782  956  213   BUN 6 - 23 mg/dL 24  30  19    Creatinine 0.40 - 1.20 mg/dL 0.86  5.78  4.69   Sodium 135 - 145 mEq/L 140  139  139   Potassium  3.5 - 5.1 mEq/L 3.7  4.6  4.3   Chloride 96 - 112 mEq/L 103  105  102   CO2 19 - 32 mEq/L 28  26  23    Calcium 8.4 - 10.5 mg/dL 8.6 - 60.4 mg/dL 54.0    98.1  9.9  19.1       Latest Ref Rng & Units 04/02/2020    3:24 PM 03/08/2015    9:55 AM 06/01/2013    7:40 PM  CMP  Glucose 70 - 99 mg/dL 478  295  621   BUN 6 - 23 mg/dL 24  30  19    Creatinine 0.40 - 1.20 mg/dL 3.08  6.57  8.46   Sodium 135 - 145 mEq/L 140  139  139   Potassium 3.5 - 5.1 mEq/L 3.7  4.6  4.3    Chloride 96 - 112 mEq/L 103  105  102   CO2 19 - 32 mEq/L 28  26  23    Calcium 8.4 - 10.5 mg/dL 8.6 - 96.2 mg/dL 95.2    84.1  9.9  32.4   Total Protein 6.0 - 8.3 g/dL   7.7   Total Bilirubin 0.3 - 1.2 mg/dL   0.3   Alkaline Phos 39 - 117 U/L   107   AST 0 - 37 U/L   19   ALT 0 - 35 U/L   17     No results found for: "CHOL", "HDL", "LDLCALC", "LDLDIRECT", "TRIG", "CHOLHDL" No results for input(s): "LIPOA" in the last 8760 hours. No components found for: "NTPROBNP" No results for input(s): "PROBNP" in the last 8760 hours. No results for input(s): "TSH" in the last 8760 hours.  [facility] database October 2024: Total cholesterol 146, triglycerides 110, HDL 70, LDL 56  May 2024: M0N 6.7  Physical Exam:    Today's Vitals   04/12/23 1013  BP: (!) 142/82  Pulse: 90  SpO2: 96%  Weight: 219 lb 12.8 oz (99.7 kg)  Height: 5\' 3"  (1.6 m)   Body mass index is 38.94 kg/m. Wt Readings from Last 3 Encounters:  04/12/23 219 lb 12.8 oz (99.7 kg)  12/28/22 217 lb (98.4 kg)  11/24/22 212 lb (96.2 kg)    Physical Exam  Constitutional: No distress.  hemodynamically stable  Neck: No JVD present.  Cardiovascular: Normal rate, regular rhythm, S1 normal and S2 normal. Exam reveals no gallop, no S3 and no S4.  No murmur heard. Pulmonary/Chest: Effort normal and breath sounds normal. No stridor. She has no wheezes. She has no rales.  Abdominal: Soft. Bowel sounds are normal. She exhibits no distension. There is no abdominal tenderness.  Musculoskeletal:        General: Edema (Trace bilaterally) present.     Cervical back: Neck supple.  Neurological: She is alert and oriented to person, place, and time. She has intact cranial nerves (2-12).  Skin: Skin is warm.   Impression & Recommendation(s):  Impression:   ICD-10-CM   1. Coronary atherosclerosis due to calcified coronary lesion  I25.10    I25.84     2. Coronary artery disease involving coronary bypass graft of native heart without  angina pectoris  I25.810     3. Pure hypercholesterolemia  E78.00 Comprehensive metabolic panel    LDL cholesterol, direct    Lipid panel    ezetimibe (ZETIA) 10 MG tablet    Lipid panel    LDL cholesterol, direct    Comprehensive metabolic panel    4. Essential hypertension  I10     5. Type 2 diabetes mellitus with complication, without long-term current use of insulin (HCC)  E11.8         Recommendation(s):  Coronary atherosclerosis due to calcified coronary lesion Total coronary calcium score 2409, 99th percentile. Currently on aspirin and statin therapy.   Echo September 2023: Preserved LVEF, grade 1 diastolic dysfunction, mild MR/TR. Exercise nuclear stress test August 2023: Low risk study. LDL as of January 2025 is 77 mg/dL, currently on maximally tolerated dose of statin therapy. Will start Zetia 10 mg p.o. daily to further reduce her LDL levels, goal is <55 mg/dL Reemphasized the importance of secondary prevention with focus on improving her modifiable cardiovascular risk factors such as glycemic control, lipid management, blood pressure control, weight loss.  Essential hypertension Office blood pressures are acceptable. Home blood pressures are very well-controlled, ambulatory blood pressure log reviewed and reported in the media section  At the last office visit reduce torsemide from 20 mg p.o. daily to 10 mg p.o. daily. Overall she is euvolemic and to help preserve renal function recommending taking torsemide 10 mg p.o. every other day and if her volume status remains stable to take it on as needed basis. Continue Avapro 150 mg p.o. every morning. Continue Farxiga 10 mg p.o. every afternoon Goal SBP 130 mmHg  Type 2 diabetes mellitus with complication, without long-term current use of insulin (HCC) Hemoglobin A1c well-controlled as of May 2024. Currently on SGLT2 inhibitors, ARB, statin therapy, Ozempic  Pure hypercholesterolemia Currently on rosuvastatin 40 mg  p.o. nightly. Most recent LDL is 77 mg/dL as per [facility] database from January 2025. Add Zetia 10 mg p.o. daily. Follow-up labs in 6 weeks to reevaluate therapy Given a total coronary calcium score of greater than 2000, diabetes, and hyperlipidemia recommend a goal LDL <55 mg/dL She denies myalgia or other side effects.  Orders Placed:  Orders Placed This Encounter  Procedures   Comprehensive metabolic panel    Standing Status:   Future    Number of Occurrences:   1    Expected Date:   05/24/2023    Expiration Date:   04/11/2024   LDL cholesterol, direct    Standing Status:   Future    Number of Occurrences:   1    Expected Date:   05/24/2023    Expiration Date:   04/11/2024   Lipid panel    Standing Status:   Future    Number of Occurrences:   1    Expected Date:   05/24/2023    Expiration Date:   04/11/2024   Final Medication List:    Meds ordered this encounter  Medications   ezetimibe (ZETIA) 10 MG tablet    Sig: Take 1 tablet (10 mg total) by mouth daily.    Dispense:  30 tablet    Refill:  3   torsemide (DEMADEX) 10 MG tablet    Sig: Take 1 tablet (10 mg total) by mouth every other day.    Dispense:  90 tablet    Refill:  1    Decreased strength to 10 mg    Medications Discontinued During This Encounter  Medication Reason   ibuprofen (ADVIL) 800 MG tablet Completed Course   torsemide (DEMADEX) 10 MG tablet      Current Outpatient Medications:    acetaminophen (TYLENOL 8 HOUR) 650 MG CR tablet, 1 tablet, Disp: , Rfl:    AgaMatrix Ultra-Thin Lancets MISC, USE   TO CHECK GLUCOSE TWICE DAILY AS DIRECTED,  Disp: , Rfl:    aspirin EC 81 MG tablet, Take 81 mg by mouth daily., Disp: , Rfl:    brimonidine-timolol (COMBIGAN) 0.2-0.5 % ophthalmic solution, Place 1 drop into both eyes every 12 (twelve) hours., Disp: , Rfl:    dapagliflozin propanediol (FARXIGA) 10 MG TABS tablet, Take 10 mg by mouth every evening., Disp: , Rfl:    esomeprazole (NEXIUM) 40 MG capsule, Take 40 mg by  mouth daily as needed. Acid reflux, Disp: , Rfl:    ezetimibe (ZETIA) 10 MG tablet, Take 1 tablet (10 mg total) by mouth daily., Disp: 30 tablet, Rfl: 3   glucose blood (ONETOUCH VERIO) test strip, USE 1 STRIP TO CHECK GLUCOSE TWICE DAILY, Disp: , Rfl:    glucose blood (ONETOUCH VERIO) test strip, USE 1 STRIP TO CHECK GLUCOSE TWICE DAILY AS DIRECTED, Disp: , Rfl:    irbesartan (AVAPRO) 150 MG tablet, Take 150 mg by mouth in the morning., Disp: , Rfl:    latanoprost (XALATAN) 0.005 % ophthalmic solution, 1 drop into each eye in the evening, Disp: , Rfl:    pregabalin (LYRICA) 50 MG capsule, Take 50 mg by mouth at bedtime., Disp: , Rfl:    rosuvastatin (CRESTOR) 40 MG tablet, Take 20 mg by mouth daily., Disp: , Rfl:    Semaglutide, 1 MG/DOSE, 2 MG/1.5ML SOPN, , Disp: , Rfl:    torsemide (DEMADEX) 10 MG tablet, Take 1 tablet (10 mg total) by mouth every other day., Disp: 90 tablet, Rfl: 1  Consent:   N/A  Disposition:   1 year follow-up sooner if needed   Her questions and concerns were addressed to her satisfaction. She voices understanding of the recommendations provided during this encounter.    Signed, Tessa Lerner, DO, [facility]  [facility]  [address] [place], [place] [address] 04/12/2023 1:17 PM

## 2023-04-14 NOTE — Progress Notes (Signed)
 [name] D.[facility] [address] [place] [address] Phone: [phone]   Assessment and Plan:     1. Right hip pain 2. Primary osteoarthritis of right hip 3. Greater trochanteric bursitis of right hip  -Chronic with exacerbation, initial sports medicine visit - Most consistent with flare of osteoarthritis of right hip with additional greater trochanteric bursitis contributing to pain - X-ray obtained in clinic.  My interpretation: Decreased joint space, cam deformity, acetabular spurring present bilaterally.  SI joint degenerative changes.  Cortical irregularities along greater trochanter bilaterally - Start Tylenol 650 mg 3 times daily - Start HEP and physical therapy. - Do not recommend NSAIDs or prednisone due to past medical history of CKD and DM type II  15 additional minutes spent for educating Therapeutic Home Exercise Program.  This included exercises focusing on stretching, strengthening, with focus on eccentric aspects.   Long term goals include an improvement in range of motion, strength, endurance as well as avoiding reinjury. Patient's frequency would include in 1-2 times a day, 3-5 times a week for a duration of 6-12 weeks. Proper technique shown and discussed handout in great detail with ATC.  All questions were discussed and answered.    Pertinent previous records reviewed include none  Follow Up: 6 to 8 weeks for reevaluation before patient's vacation in June 2025.  May follow-up sooner if needed.  Could consider greater trochanteric CSI versus intra-articular CSI   Subjective:   I, [name], am serving as a Neurosurgeon for Doctor Richardean Sale  Chief Complaint: right hip pain   HPI:   04/17/2023 Patient is a 78 year old female with right hip pain. Patient states long hx of hip pain for about 2 years. Pain in the am is strong with some stiffness. The past month the pain has increased she isnt able to sleep through  the night. She has to sleep in a lounge chair. She gets beating pain. Pain radiates down to the knee. Family hx of bone cancer she is very afraid of this. She has to schedule PT. The pain is now constant. Tylenol helps for 8 hours the pain is muted. Hx of falling arches   Relevant Historical Information: DM type II, CKD, hypertension  Additional pertinent review of systems negative.   Current Outpatient Medications:    acetaminophen (TYLENOL 8 HOUR) 650 MG CR tablet, 1 tablet, Disp: , Rfl:    AgaMatrix Ultra-Thin Lancets MISC, USE   TO CHECK GLUCOSE TWICE DAILY AS DIRECTED, Disp: , Rfl:    aspirin EC 81 MG tablet, Take 81 mg by mouth daily., Disp: , Rfl:    brimonidine-timolol (COMBIGAN) 0.2-0.5 % ophthalmic solution, Place 1 drop into both eyes every 12 (twelve) hours., Disp: , Rfl:    dapagliflozin propanediol (FARXIGA) 10 MG TABS tablet, Take 10 mg by mouth every evening., Disp: , Rfl:    esomeprazole (NEXIUM) 40 MG capsule, Take 40 mg by mouth daily as needed. Acid reflux, Disp: , Rfl:    ezetimibe (ZETIA) 10 MG tablet, Take 1 tablet (10 mg total) by mouth daily., Disp: 30 tablet, Rfl: 3   glucose blood (ONETOUCH VERIO) test strip, USE 1 STRIP TO CHECK GLUCOSE TWICE DAILY, Disp: , Rfl:    glucose blood (ONETOUCH VERIO) test strip, USE 1 STRIP TO CHECK GLUCOSE TWICE DAILY AS DIRECTED, Disp: , Rfl:    irbesartan (AVAPRO) 150 MG tablet, Take 150 mg by mouth in the morning., Disp: , Rfl:  latanoprost (XALATAN) 0.005 % ophthalmic solution, 1 drop into each eye in the evening, Disp: , Rfl:    pregabalin (LYRICA) 50 MG capsule, Take 50 mg by mouth at bedtime., Disp: , Rfl:    rosuvastatin (CRESTOR) 40 MG tablet, Take 20 mg by mouth daily., Disp: , Rfl:    Semaglutide, 1 MG/DOSE, 2 MG/1.5ML SOPN, , Disp: , Rfl:    torsemide (DEMADEX) 10 MG tablet, Take 1 tablet (10 mg total) by mouth every other day., Disp: 90 tablet, Rfl: 1   Objective:     Vitals:   04/17/23 1309  Pulse: 77  SpO2: 99%   Weight: 218 lb (98.9 kg)  Height: 5\' 3"  (1.6 m)      Body mass index is 38.62 kg/m.    Physical Exam:    General: awake, alert, and oriented no acute distress, nontoxic Skin: no suspicious lesions or rashes Neuro:sensation intact distally with no deficits, normal muscle tone, no atrophy, strength 5/5 in all tested lower ext groups Psych: normal mood and affect, speech clear   Right hip: No deformity, swelling or wasting ROM Flexion 90, ext 30, IR 35, ER 45 TTP greater trochanter NTTP over the hip flexors, IT band, gluteal musculature, si joint, lumbar spine Negative log roll with FROM Positive FABER lateral and posterior hip pain Positive FADIR lateral and posterior hip pain Negative Piriformis test Negative trendelenberg Gait normal    Electronically signed by:  [name] D.[facility] 1:41 PM 04/17/23

## 2023-04-17 ENCOUNTER — Ambulatory Visit: Admitting: Sports Medicine

## 2023-04-17 ENCOUNTER — Ambulatory Visit (INDEPENDENT_AMBULATORY_CARE_PROVIDER_SITE_OTHER)

## 2023-04-17 VITALS — HR 77 | Ht 63.0 in | Wt 218.0 lb

## 2023-04-17 DIAGNOSIS — M1611 Unilateral primary osteoarthritis, right hip: Secondary | ICD-10-CM

## 2023-04-17 DIAGNOSIS — M25551 Pain in right hip: Secondary | ICD-10-CM

## 2023-04-17 DIAGNOSIS — M47816 Spondylosis without myelopathy or radiculopathy, lumbar region: Secondary | ICD-10-CM | POA: Diagnosis not present

## 2023-04-17 DIAGNOSIS — M7061 Trochanteric bursitis, right hip: Secondary | ICD-10-CM | POA: Diagnosis not present

## 2023-04-17 DIAGNOSIS — M160 Bilateral primary osteoarthritis of hip: Secondary | ICD-10-CM | POA: Diagnosis not present

## 2023-04-17 NOTE — Patient Instructions (Signed)
 Hip HEP  Continue PT Tylenol 650 mg 3x daily  6-8 week follow up, or sooner if needed

## 2023-04-18 DIAGNOSIS — Z961 Presence of intraocular lens: Secondary | ICD-10-CM | POA: Diagnosis not present

## 2023-04-18 DIAGNOSIS — H401132 Primary open-angle glaucoma, bilateral, moderate stage: Secondary | ICD-10-CM | POA: Diagnosis not present

## 2023-04-18 DIAGNOSIS — E119 Type 2 diabetes mellitus without complications: Secondary | ICD-10-CM | POA: Diagnosis not present

## 2023-04-26 DIAGNOSIS — M25551 Pain in right hip: Secondary | ICD-10-CM | POA: Diagnosis not present

## 2023-05-02 ENCOUNTER — Encounter: Payer: Self-pay | Admitting: Sports Medicine

## 2023-05-02 DIAGNOSIS — M25551 Pain in right hip: Secondary | ICD-10-CM | POA: Diagnosis not present

## 2023-05-04 DIAGNOSIS — M25551 Pain in right hip: Secondary | ICD-10-CM | POA: Diagnosis not present

## 2023-05-09 DIAGNOSIS — M25551 Pain in right hip: Secondary | ICD-10-CM | POA: Diagnosis not present

## 2023-05-11 ENCOUNTER — Ambulatory Visit

## 2023-05-11 DIAGNOSIS — E118 Type 2 diabetes mellitus with unspecified complications: Secondary | ICD-10-CM

## 2023-05-11 DIAGNOSIS — M25551 Pain in right hip: Secondary | ICD-10-CM | POA: Diagnosis not present

## 2023-05-11 DIAGNOSIS — M2141 Flat foot [pes planus] (acquired), right foot: Secondary | ICD-10-CM

## 2023-05-11 NOTE — Progress Notes (Signed)
 You are not required to submit a notification/prior authorization based on the information provided. If you have general questions about the prior authorization requirements, visit [email or web address] > Clinician Resources > Advance and Admission Notification Requirements. The number above acknowledges your notification. Please write this reference number down for future reference. If you would like to request an organization determination, please call us at [phone]. Decision ID #: [id number]  Prior auth requirements for Shoes [phone] check_circle Notification or Prior Authorization is not required for the requested services You are not required to submit a notification/prior authorization based on the information provided. If you have general questions about the prior authorization requirements, visit [email or web address] > Clinician Resources > Advance and Admission Notification Requirements. The number above acknowledges your notification. Please write this reference number down for future reference. If you would like to request an organization determination, please call us at [phone]. Decision ID #: [id number] The number above acknowled Prior auth requirements inserts [phone]   Patient presents to the office today for diabetic shoe and insole measuring.  Patient was measured with brannock device to determine size and width for 1 pair of extra depth shoes and foam casted for 3 pair of insoles.   Documentation of medical necessity will be sent to patient's treating diabetic doctor to verify and sign.   Patient's diabetic provider: Merri Brunette MD   Shoes and insoles will be ordered at that time and patient will be notified for an appointment for fitting when they arrive.  Shoe size (per patient): 8.5 Shoe choice:   [id number]  Shoe size ordered: 8.5 XWD  Ppw and ABN signed  Need to create iPad order when ppw recev'd from treating Dr.

## 2023-05-13 LAB — COMPREHENSIVE METABOLIC PANEL WITH GFR
ALT: 16 IU/L (ref 0–32)
AST: 22 IU/L (ref 0–40)
Albumin: 4 g/dL (ref 3.8–4.8)
Alkaline Phosphatase: 87 IU/L (ref 44–121)
BUN/Creatinine Ratio: 13 (ref 12–28)
BUN: 19 mg/dL (ref 8–27)
Bilirubin Total: 0.6 mg/dL (ref 0.0–1.2)
CO2: 20 mmol/L (ref 20–29)
Calcium: 10.2 mg/dL (ref 8.7–10.3)
Chloride: 104 mmol/L (ref 96–106)
Creatinine, Ser: 1.51 mg/dL — ABNORMAL HIGH (ref 0.57–1.00)
Globulin, Total: 3.1 g/dL (ref 1.5–4.5)
Glucose: 100 mg/dL — ABNORMAL HIGH (ref 70–99)
Potassium: 4.7 mmol/L (ref 3.5–5.2)
Sodium: 139 mmol/L (ref 134–144)
Total Protein: 7.1 g/dL (ref 6.0–8.5)
eGFR: 36 mL/min/{1.73_m2} — ABNORMAL LOW (ref >59)

## 2023-05-13 LAB — LIPID PANEL
Chol/HDL Ratio: 1.7 ratio (ref 0.0–4.4)
Cholesterol, Total: 99 mg/dL — ABNORMAL LOW (ref 100–199)
HDL: 60 mg/dL (ref >39)
LDL Chol Calc (NIH): 22 mg/dL (ref 0–99)
Triglycerides: 88 mg/dL (ref 0–149)
VLDL Cholesterol Cal: 17 mg/dL (ref 5–40)

## 2023-05-13 LAB — LDL CHOLESTEROL, DIRECT: LDL Direct: 20 mg/dL (ref 0–99)

## 2023-05-16 DIAGNOSIS — M25551 Pain in right hip: Secondary | ICD-10-CM | POA: Diagnosis not present

## 2023-05-18 DIAGNOSIS — M25551 Pain in right hip: Secondary | ICD-10-CM | POA: Diagnosis not present

## 2023-05-20 ENCOUNTER — Encounter: Payer: Self-pay | Admitting: Cardiology

## 2023-05-30 DIAGNOSIS — M25551 Pain in right hip: Secondary | ICD-10-CM | POA: Diagnosis not present

## 2023-06-01 ENCOUNTER — Ambulatory Visit: Payer: Medicare Other | Admitting: Podiatry

## 2023-06-01 ENCOUNTER — Encounter: Payer: Self-pay | Admitting: Podiatry

## 2023-06-01 DIAGNOSIS — B351 Tinea unguium: Secondary | ICD-10-CM | POA: Diagnosis not present

## 2023-06-01 DIAGNOSIS — M79674 Pain in right toe(s): Secondary | ICD-10-CM | POA: Diagnosis not present

## 2023-06-01 DIAGNOSIS — E118 Type 2 diabetes mellitus with unspecified complications: Secondary | ICD-10-CM

## 2023-06-01 DIAGNOSIS — M79675 Pain in left toe(s): Secondary | ICD-10-CM | POA: Diagnosis not present

## 2023-06-01 DIAGNOSIS — M25551 Pain in right hip: Secondary | ICD-10-CM | POA: Diagnosis not present

## 2023-06-01 NOTE — Progress Notes (Signed)
 This patient returns to my office for at risk foot care.  This patient requires this care by a professional since this patient will be at risk due to having dabetes and CKD.   This patient is unable to cut nails herself since the patient cannot reach her nails.These nails are painful walking and wearing shoes.  This patient presents for at risk foot care today.  General Appearance  Alert, conversant and in no acute stress.  Vascular  Dorsalis pedis  pulses are palpable  bilaterally. Posterior tibial pulses are absent  bilaterally. Capillary return is within normal limits  bilaterally. Temperature is within normal limits  bilaterally.  Neurologic  Senn-Weinstein monofilament wire test within normal limits  bilaterally. Muscle power within normal limits bilaterally.  Nails Thick disfigured discolored nails with subungual debris  from hallux to fifth toes bilaterally. No evidence of bacterial infection or drainage bilaterally.  Orthopedic  No limitations of motion  feet .  No crepitus or effusions noted.  No bony pathology or digital deformities noted.  HAV  B/L.  Peroneal spastic flaftoot left.    Skin  normotropic skin with no porokeratosis noted bilaterally.  No signs of infections or ulcers noted.     Onychomycosis  Pain in right toes  Pain in left toes  Consent was obtained for treatment procedures.   Mechanical debridement of nails 1-5  bilaterally performed with a nail nipper.  Filed with dremel without incident.    Return office visit   10 weeks                 Told patient to return for periodic foot care and evaluation due to potential at risk complications.   Ruffin Cotton DPM

## 2023-06-05 DIAGNOSIS — M25551 Pain in right hip: Secondary | ICD-10-CM | POA: Diagnosis not present

## 2023-06-06 DIAGNOSIS — Z961 Presence of intraocular lens: Secondary | ICD-10-CM | POA: Diagnosis not present

## 2023-06-06 DIAGNOSIS — H401132 Primary open-angle glaucoma, bilateral, moderate stage: Secondary | ICD-10-CM | POA: Diagnosis not present

## 2023-06-06 DIAGNOSIS — E119 Type 2 diabetes mellitus without complications: Secondary | ICD-10-CM | POA: Diagnosis not present

## 2023-06-07 DIAGNOSIS — M25551 Pain in right hip: Secondary | ICD-10-CM | POA: Diagnosis not present

## 2023-06-12 ENCOUNTER — Ambulatory Visit: Admitting: Sports Medicine

## 2023-06-12 NOTE — Progress Notes (Unsigned)
    [name] D.[facility] [address] [place] [address] Phone: [phone]   Assessment and Plan:     There are no diagnoses linked to this encounter.  ***   Pertinent previous records reviewed include ***    Follow Up: ***     Subjective:   I, [name], am serving as a Neurosurgeon for Doctor [name]   Chief Complaint: right hip pain    HPI:    04/17/2023 Patient is a 78 year old female with right hip pain. Patient states long hx of hip pain for about 2 years. Pain in the am is strong with some stiffness. The past month the pain has increased she isnt able to sleep through the night. She has to sleep in a lounge chair. She gets beating pain. Pain radiates down to the knee. Family hx of bone cancer she is very afraid of this. She has to schedule PT. The pain is now constant. Tylenol  helps for 8 hours the pain is muted. Hx of falling arches   06/13/2023 Patient states   Relevant Historical Information: DM type II, CKD, hypertension Additional pertinent review of systems negative.   Current Outpatient Medications:    acetaminophen  (TYLENOL  8 HOUR) 650 MG CR tablet, 1 tablet, Disp: , Rfl:    AgaMatrix Ultra-Thin Lancets MISC, USE   TO CHECK GLUCOSE TWICE DAILY AS DIRECTED, Disp: , Rfl:    aspirin EC 81 MG tablet, Take 81 mg by mouth daily., Disp: , Rfl:    brimonidine-timolol (COMBIGAN) 0.2-0.5 % ophthalmic solution, Place 1 drop into both eyes every 12 (twelve) hours., Disp: , Rfl:    dapagliflozin propanediol (FARXIGA) 10 MG TABS tablet, Take 10 mg by mouth every evening., Disp: , Rfl:    esomeprazole (NEXIUM) 40 MG capsule, Take 40 mg by mouth daily as needed. Acid reflux, Disp: , Rfl:    ezetimibe  (ZETIA ) 10 MG tablet, Take 1 tablet (10 mg total) by mouth daily., Disp: 30 tablet, Rfl: 3   glucose blood (ONETOUCH VERIO) test strip, USE 1 STRIP TO CHECK GLUCOSE TWICE DAILY, Disp: , Rfl:    glucose blood (ONETOUCH VERIO)  test strip, USE 1 STRIP TO CHECK GLUCOSE TWICE DAILY AS DIRECTED, Disp: , Rfl:    irbesartan (AVAPRO) 150 MG tablet, Take 150 mg by mouth in the morning., Disp: , Rfl:    latanoprost (XALATAN) 0.005 % ophthalmic solution, 1 drop into each eye in the evening, Disp: , Rfl:    pregabalin (LYRICA) 50 MG capsule, Take 50 mg by mouth at bedtime., Disp: , Rfl:    rosuvastatin  (CRESTOR ) 40 MG tablet, Take 20 mg by mouth daily., Disp: , Rfl:    Semaglutide, 1 MG/DOSE, 2 MG/1.5ML SOPN, , Disp: , Rfl:    torsemide  (DEMADEX ) 10 MG tablet, Take 1 tablet (10 mg total) by mouth every other day., Disp: 90 tablet, Rfl: 1   Objective:     There were no vitals filed for this visit.    There is no height or weight on file to calculate BMI.    Physical Exam:    ***   Electronically signed by:  [name] D.[facility] 7:33 AM 06/12/23

## 2023-06-13 ENCOUNTER — Ambulatory Visit: Admitting: Sports Medicine

## 2023-06-13 VITALS — BP 140/90 | HR 77 | Ht 63.0 in | Wt 220.8 lb

## 2023-06-13 DIAGNOSIS — M7061 Trochanteric bursitis, right hip: Secondary | ICD-10-CM | POA: Diagnosis not present

## 2023-06-13 DIAGNOSIS — M722 Plantar fascial fibromatosis: Secondary | ICD-10-CM

## 2023-06-13 DIAGNOSIS — M1611 Unilateral primary osteoarthritis, right hip: Secondary | ICD-10-CM

## 2023-06-13 DIAGNOSIS — M25551 Pain in right hip: Secondary | ICD-10-CM | POA: Diagnosis not present

## 2023-06-13 NOTE — Patient Instructions (Signed)
 Plantar Fascitis HEP. Follow up in 1 month.

## 2023-06-19 ENCOUNTER — Telehealth: Payer: Self-pay | Admitting: Podiatry

## 2023-06-19 NOTE — Telephone Encounter (Signed)
 Pt asks are her orthotics ready?

## 2023-06-19 NOTE — Telephone Encounter (Signed)
 LVM.. shoes are not here. Still waiting on signature from doc. Last ppw sent was 06/15/23.

## 2023-06-20 DIAGNOSIS — N1832 Chronic kidney disease, stage 3b: Secondary | ICD-10-CM | POA: Diagnosis not present

## 2023-06-20 DIAGNOSIS — I10 Essential (primary) hypertension: Secondary | ICD-10-CM | POA: Diagnosis not present

## 2023-06-20 DIAGNOSIS — M25551 Pain in right hip: Secondary | ICD-10-CM | POA: Diagnosis not present

## 2023-06-20 DIAGNOSIS — E1121 Type 2 diabetes mellitus with diabetic nephropathy: Secondary | ICD-10-CM | POA: Diagnosis not present

## 2023-06-20 DIAGNOSIS — I2510 Atherosclerotic heart disease of native coronary artery without angina pectoris: Secondary | ICD-10-CM | POA: Diagnosis not present

## 2023-06-20 DIAGNOSIS — E782 Mixed hyperlipidemia: Secondary | ICD-10-CM | POA: Diagnosis not present

## 2023-06-20 DIAGNOSIS — I700 Atherosclerosis of aorta: Secondary | ICD-10-CM | POA: Diagnosis not present

## 2023-06-22 DIAGNOSIS — M25551 Pain in right hip: Secondary | ICD-10-CM | POA: Diagnosis not present

## 2023-06-27 DIAGNOSIS — M25551 Pain in right hip: Secondary | ICD-10-CM | POA: Diagnosis not present

## 2023-06-29 DIAGNOSIS — M25551 Pain in right hip: Secondary | ICD-10-CM | POA: Diagnosis not present

## 2023-07-04 ENCOUNTER — Other Ambulatory Visit: Payer: Self-pay | Admitting: Internal Medicine

## 2023-07-04 DIAGNOSIS — Z1231 Encounter for screening mammogram for malignant neoplasm of breast: Secondary | ICD-10-CM

## 2023-07-07 ENCOUNTER — Ambulatory Visit
Admission: RE | Admit: 2023-07-07 | Discharge: 2023-07-07 | Disposition: A | Discharge status: Home / Self Care | Source: Ambulatory Visit | Attending: Internal Medicine | Admitting: Internal Medicine

## 2023-07-07 DIAGNOSIS — Z1231 Encounter for screening mammogram for malignant neoplasm of breast: Secondary | ICD-10-CM | POA: Diagnosis not present

## 2023-07-13 NOTE — Progress Notes (Signed)
 Ben Quint Chestnut D.[facility] [address] [place] [address] Phone: [phone]   Assessment and Plan:     1. Right hip pain (Primary) 2. Primary osteoarthritis of right hip 3. Greater trochanteric bursitis of right hip -Chronic with exacerbation, subsequent visit - Over overall improvement in right lateral hip pain most consistent with resolving greater trochanteric bursitis with Tylenol  and HEP and physical therapy - Patient still has pinpoint area of tenderness over lateral hip consistent with greater trochanteric bursitis.  Could consider CSI if no improvement by follow-up visit  4. Plantar fasciitis, left -Acute, improving, subsequent visit - Overall improvement in left foot pain most consistent with resolving plantar fasciitis.  Degenerative changes through ankle may also be contributing to intermittent chronic pain - Could consider CSI if necessary at follow-up visit - Continue HEP and Tylenol  for day-to-day pain relief  5. Acute pain of right knee -Acute, initial visit - Acute flare of right knee pain, likely aggravated by compensation with ongoing right hip and left foot pain.  Could represent flare of osteoarthritis - Will obtain x-ray at today's visit and review at follow-up visit - Could consider CSI if no improvement by follow-up visit - Continue Tylenol  for day-to-day pain relief   Pertinent previous records reviewed include none  Follow Up: 1 week for reevaluation.  Could consider CSI before patient's trip to [place]   Subjective:   I, Adrienne Horning, am serving as a scribe for Dr. Ulysees Gander  Chief Complaint: right hip pain   HPI:   04/17/2023 Patient is a 78 year old female with right hip pain. Patient states long hx of hip pain for about 2 years. Pain in the am is strong with some stiffness. The past month the pain has increased she isnt able to sleep through the night. She has to sleep in a lounge chair. She gets  beating pain. Pain radiates down to the knee. Family hx of bone cancer she is very afraid of this. She has to schedule PT. The pain is now constant. Tylenol  helps for 8 hours the pain is muted. Hx of falling arches    06/13/2023 Patient states that she has been going to physical therapy and it has helped. As long as she keeps moving and do the exercises things are good. Was able to go to the graduation and walk the distance from the car and the location without issue. Some days she gets up the hip does not cooperate. Patient would like to use the elliptical that she has at home to keep moving. There are days where her left ankle has issues. Going to [employer] in June and want to prepare for the trip. Would like recommendations to help her keep moving and calm the hip and ankle issues down.   07/14/23 Patient states she though she was doing better, but she has been trying to build the strength up in her legs. Hip did get better with PT, but insurance stopped paying because she was functional. Patient went back to her gym doing the bike for 30 minutes and doing leg workouts on the machine. Last night patient was hurting very bad around the right knee area. Not sure if it was from the gym or what.   Relevant Historical Information: DM type II, CKD, hypertension   Additional pertinent review of systems negative.   Current Outpatient Medications:    acetaminophen  (TYLENOL  8 HOUR) 650 MG CR tablet, 1 tablet, Disp: , Rfl:  AgaMatrix Ultra-Thin Lancets MISC, USE   TO CHECK GLUCOSE TWICE DAILY AS DIRECTED, Disp: , Rfl:    aspirin EC 81 MG tablet, Take 81 mg by mouth daily., Disp: , Rfl:    brimonidine-timolol (COMBIGAN) 0.2-0.5 % ophthalmic solution, Place 1 drop into both eyes every 12 (twelve) hours., Disp: , Rfl:    dapagliflozin propanediol (FARXIGA) 10 MG TABS tablet, Take 10 mg by mouth every evening., Disp: , Rfl:    esomeprazole (NEXIUM) 40 MG capsule, Take 40 mg by mouth daily as needed. Acid  reflux, Disp: , Rfl:    ezetimibe  (ZETIA ) 10 MG tablet, Take 1 tablet (10 mg total) by mouth daily., Disp: 30 tablet, Rfl: 3   glucose blood (ONETOUCH VERIO) test strip, USE 1 STRIP TO CHECK GLUCOSE TWICE DAILY, Disp: , Rfl:    glucose blood (ONETOUCH VERIO) test strip, USE 1 STRIP TO CHECK GLUCOSE TWICE DAILY AS DIRECTED, Disp: , Rfl:    irbesartan (AVAPRO) 150 MG tablet, Take 150 mg by mouth in the morning., Disp: , Rfl:    latanoprost (XALATAN) 0.005 % ophthalmic solution, 1 drop into each eye in the evening, Disp: , Rfl:    pregabalin (LYRICA) 50 MG capsule, Take 50 mg by mouth at bedtime., Disp: , Rfl:    rosuvastatin  (CRESTOR ) 40 MG tablet, Take 20 mg by mouth daily., Disp: , Rfl:    Semaglutide, 1 MG/DOSE, 2 MG/1.5ML SOPN, , Disp: , Rfl:    torsemide  (DEMADEX ) 10 MG tablet, Take 1 tablet (10 mg total) by mouth every other day., Disp: 90 tablet, Rfl: 1   Objective:     Vitals:   07/14/23 1100  BP: 130/80  Pulse: 83  SpO2: 97%  Height: 5' 3 (1.6 m)      Body mass index is 39.11 kg/m.    Physical Exam:    General: awake, alert, and oriented no acute distress, nontoxic Skin: no suspicious lesions or rashes Neuro:sensation intact distally with no deficits, normal muscle tone, no atrophy, strength 5/5 in all tested lower ext groups Psych: normal mood and affect, speech clear   Right hip: No deformity, swelling or wasting ROM Flexion 90, ext 30, IR 35, ER 45 TTP greater trochanter NTTP over the hip flexors, IT band, gluteal musculature, si joint, lumbar spine Negative log roll with FROM Positive FABER lateral and posterior hip pain Positive FADIR lateral and posterior hip pain Negative Piriformis test Negative trendelenberg Gait normal    Left foot: Mild TTP along plantar fascia and weightbearing surface of calcaneus    Electronically signed by:  Marshall Skeeter D.[facility] 11:50 AM 07/14/23

## 2023-07-14 ENCOUNTER — Ambulatory Visit: Admitting: Sports Medicine

## 2023-07-14 ENCOUNTER — Ambulatory Visit (INDEPENDENT_AMBULATORY_CARE_PROVIDER_SITE_OTHER)

## 2023-07-14 VITALS — BP 130/80 | HR 83 | Ht 63.0 in

## 2023-07-14 DIAGNOSIS — M25551 Pain in right hip: Secondary | ICD-10-CM | POA: Diagnosis not present

## 2023-07-14 DIAGNOSIS — M1611 Unilateral primary osteoarthritis, right hip: Secondary | ICD-10-CM

## 2023-07-14 DIAGNOSIS — M25561 Pain in right knee: Secondary | ICD-10-CM | POA: Diagnosis not present

## 2023-07-14 DIAGNOSIS — M7061 Trochanteric bursitis, right hip: Secondary | ICD-10-CM | POA: Diagnosis not present

## 2023-07-14 DIAGNOSIS — M722 Plantar fascial fibromatosis: Secondary | ICD-10-CM

## 2023-07-14 NOTE — Patient Instructions (Addendum)
 Good to see you  X ray on the way out Tylenol  [phone] mg 2-3 times a day for pain relief   Follow up in a week

## 2023-07-17 ENCOUNTER — Ambulatory Visit: Payer: Self-pay | Admitting: Sports Medicine

## 2023-07-19 NOTE — Progress Notes (Unsigned)
 Ben Jackson D.[facility] [address] [place] [address] Phone: [phone]   Assessment and Plan:     There are no diagnoses linked to this encounter.  ***   Pertinent previous records reviewed include ***    Follow Up: ***     Subjective:   I, Shirly Bartosiewicz, am serving as a Neurosurgeon for Doctor Ulysees Gander  Chief Complaint: right hip pain    HPI:    04/17/2023 Patient is a 78 year old female with right hip pain. Patient states long hx of hip pain for about 2 years. Pain in the am is strong with some stiffness. The past month the pain has increased she isnt able to sleep through the night. She has to sleep in a lounge chair. She gets beating pain. Pain radiates down to the knee. Family hx of bone cancer she is very afraid of this. She has to schedule PT. The pain is now constant. Tylenol  helps for 8 hours the pain is muted. Hx of falling arches    06/13/2023 Patient states that she has been going to physical therapy and it has helped. As long as she keeps moving and do the exercises things are good. Was able to go to the graduation and walk the distance from the car and the location without issue. Some days she gets up the hip does not cooperate. Patient would like to use the elliptical that she has at home to keep moving. There are days where her left ankle has issues. Going to [employer] in June and want to prepare for the trip. Would like recommendations to help her keep moving and calm the hip and ankle issues down.    07/14/23 Patient states she though she was doing better, but she has been trying to build the strength up in her legs. Hip did get better with PT, but insurance stopped paying because she was functional. Patient went back to her gym doing the bike for 30 minutes and doing leg workouts on the machine. Last night patient was hurting very bad around the right knee area. Not sure if it was from the gym or what.    07/20/2023 Patient states   Relevant Historical Information: DM type II, CKD, hypertension     Additional pertinent review of systems negative.   Current Outpatient Medications:    acetaminophen  (TYLENOL  8 HOUR) 650 MG CR tablet, 1 tablet, Disp: , Rfl:    AgaMatrix Ultra-Thin Lancets MISC, USE   TO CHECK GLUCOSE TWICE DAILY AS DIRECTED, Disp: , Rfl:    aspirin EC 81 MG tablet, Take 81 mg by mouth daily., Disp: , Rfl:    brimonidine-timolol (COMBIGAN) 0.2-0.5 % ophthalmic solution, Place 1 drop into both eyes every 12 (twelve) hours., Disp: , Rfl:    dapagliflozin propanediol (FARXIGA) 10 MG TABS tablet, Take 10 mg by mouth every evening., Disp: , Rfl:    esomeprazole (NEXIUM) 40 MG capsule, Take 40 mg by mouth daily as needed. Acid reflux, Disp: , Rfl:    ezetimibe  (ZETIA ) 10 MG tablet, Take 1 tablet (10 mg total) by mouth daily., Disp: 30 tablet, Rfl: 3   glucose blood (ONETOUCH VERIO) test strip, USE 1 STRIP TO CHECK GLUCOSE TWICE DAILY, Disp: , Rfl:    glucose blood (ONETOUCH VERIO) test strip, USE 1 STRIP TO CHECK GLUCOSE TWICE DAILY AS DIRECTED, Disp: , Rfl:    irbesartan (AVAPRO) 150 MG tablet, Take 150 mg by mouth in the morning.,  Disp: , Rfl:    latanoprost (XALATAN) 0.005 % ophthalmic solution, 1 drop into each eye in the evening, Disp: , Rfl:    pregabalin (LYRICA) 50 MG capsule, Take 50 mg by mouth at bedtime., Disp: , Rfl:    rosuvastatin  (CRESTOR ) 40 MG tablet, Take 20 mg by mouth daily., Disp: , Rfl:    Semaglutide, 1 MG/DOSE, 2 MG/1.5ML SOPN, , Disp: , Rfl:    torsemide  (DEMADEX ) 10 MG tablet, Take 1 tablet (10 mg total) by mouth every other day., Disp: 90 tablet, Rfl: 1   Objective:     There were no vitals filed for this visit.    There is no height or weight on file to calculate BMI.    Physical Exam:    ***   Electronically signed by:  Marshall Skeeter D.[facility] 7:50 AM 07/19/23

## 2023-07-20 ENCOUNTER — Ambulatory Visit: Admitting: Sports Medicine

## 2023-07-20 ENCOUNTER — Other Ambulatory Visit: Payer: Self-pay

## 2023-07-20 VITALS — HR 91 | Ht 63.0 in | Wt 220.0 lb

## 2023-07-20 DIAGNOSIS — M1711 Unilateral primary osteoarthritis, right knee: Secondary | ICD-10-CM | POA: Diagnosis not present

## 2023-07-20 DIAGNOSIS — G8929 Other chronic pain: Secondary | ICD-10-CM

## 2023-07-20 DIAGNOSIS — M25561 Pain in right knee: Secondary | ICD-10-CM | POA: Diagnosis not present

## 2023-07-20 DIAGNOSIS — M7061 Trochanteric bursitis, right hip: Secondary | ICD-10-CM | POA: Diagnosis not present

## 2023-07-20 DIAGNOSIS — M25551 Pain in right hip: Secondary | ICD-10-CM

## 2023-07-20 DIAGNOSIS — M25572 Pain in left ankle and joints of left foot: Secondary | ICD-10-CM

## 2023-07-20 DIAGNOSIS — M1611 Unilateral primary osteoarthritis, right hip: Secondary | ICD-10-CM | POA: Diagnosis not present

## 2023-07-20 NOTE — Patient Instructions (Signed)
 4 week follow up

## 2023-08-02 DIAGNOSIS — N183 Chronic kidney disease, stage 3 unspecified: Secondary | ICD-10-CM | POA: Diagnosis not present

## 2023-08-02 DIAGNOSIS — Z79899 Other long term (current) drug therapy: Secondary | ICD-10-CM | POA: Diagnosis not present

## 2023-08-02 DIAGNOSIS — Z83511 Family history of glaucoma: Secondary | ICD-10-CM | POA: Diagnosis not present

## 2023-08-02 DIAGNOSIS — I2510 Atherosclerotic heart disease of native coronary artery without angina pectoris: Secondary | ICD-10-CM | POA: Diagnosis not present

## 2023-08-02 DIAGNOSIS — H42 Glaucoma in diseases classified elsewhere: Secondary | ICD-10-CM | POA: Diagnosis not present

## 2023-08-02 DIAGNOSIS — Z7984 Long term (current) use of oral hypoglycemic drugs: Secondary | ICD-10-CM | POA: Diagnosis not present

## 2023-08-02 DIAGNOSIS — H401112 Primary open-angle glaucoma, right eye, moderate stage: Secondary | ICD-10-CM | POA: Diagnosis not present

## 2023-08-02 DIAGNOSIS — E1139 Type 2 diabetes mellitus with other diabetic ophthalmic complication: Secondary | ICD-10-CM | POA: Diagnosis not present

## 2023-08-02 DIAGNOSIS — Z7985 Long-term (current) use of injectable non-insulin antidiabetic drugs: Secondary | ICD-10-CM | POA: Diagnosis not present

## 2023-08-02 DIAGNOSIS — Z7982 Long term (current) use of aspirin: Secondary | ICD-10-CM | POA: Diagnosis not present

## 2023-08-02 DIAGNOSIS — E785 Hyperlipidemia, unspecified: Secondary | ICD-10-CM | POA: Diagnosis not present

## 2023-08-02 DIAGNOSIS — I129 Hypertensive chronic kidney disease with stage 1 through stage 4 chronic kidney disease, or unspecified chronic kidney disease: Secondary | ICD-10-CM | POA: Diagnosis not present

## 2023-08-02 DIAGNOSIS — K219 Gastro-esophageal reflux disease without esophagitis: Secondary | ICD-10-CM | POA: Diagnosis not present

## 2023-08-02 DIAGNOSIS — E1122 Type 2 diabetes mellitus with diabetic chronic kidney disease: Secondary | ICD-10-CM | POA: Diagnosis not present

## 2023-08-10 ENCOUNTER — Telehealth: Payer: Self-pay

## 2023-08-10 NOTE — Telephone Encounter (Signed)
 Patient called re: shoes I looked at [facility] and they had emailed me that they needed scans I sent them to [employer] [facility] on 6/17 they are still sitting with a PO and no tracking number I emailed them today to ask if inserts are being made and if they can put a rush on it I will update patient once I hear back from [facility]

## 2023-08-11 ENCOUNTER — Ambulatory Visit: Admitting: Podiatry

## 2023-08-14 ENCOUNTER — Other Ambulatory Visit: Payer: Self-pay | Admitting: Cardiology

## 2023-08-14 DIAGNOSIS — E7800 Pure hypercholesterolemia, unspecified: Secondary | ICD-10-CM

## 2023-08-22 NOTE — Progress Notes (Unsigned)
 [name] D.[name] [address] [place] [address] Phone: [phone]   Assessment and Plan:     There are no diagnoses linked to this encounter.  ***   Pertinent previous records reviewed include ***    Follow Up: ***     Subjective:   I, [name], am serving as a Neurosurgeon for Doctor Morene Mace  Chief Complaint: right hip pain    HPI:    04/17/2023 Patient is a 78 year old female with right hip pain. Patient states long hx of hip pain for about 2 years. Pain in the am is strong with some stiffness. The past month the pain has increased she isnt able to sleep through the night. She has to sleep in a lounge chair. She gets beating pain. Pain radiates down to the knee. Family hx of bone cancer she is very afraid of this. She has to schedule PT. The pain is now constant. Tylenol  helps for 8 hours the pain is muted. Hx of falling arches    06/13/2023 Patient states that she has been going to physical therapy and it has helped. As long as she keeps moving and do the exercises things are good. Was able to go to the graduation and walk the distance from the car and the location without issue. Some days she gets up the hip does not cooperate. Patient would like to use the elliptical that she has at home to keep moving. There are days where her left ankle has issues. Going to [employer] in June and want to prepare for the trip. Would like recommendations to help her keep moving and calm the hip and ankle issues down.    07/14/23 Patient states she though she was doing better, but she has been trying to build the strength up in her legs. Hip did get better with PT, but insurance stopped paying because she was functional. Patient went back to her gym doing the bike for 30 minutes and doing leg workouts on the machine. Last night patient was hurting very bad around the right knee area. Not sure if it was from the gym or what.     07/20/2023 Patient states she is okay. She has two pressure points she would like shots for   08/23/2023 Patient states   Relevant Historical Information: DM type II, CKD, hypertension   Additional pertinent review of systems negative.   Current Outpatient Medications:    acetaminophen  (TYLENOL  8 HOUR) 650 MG CR tablet, 1 tablet, Disp: , Rfl:    AgaMatrix Ultra-Thin Lancets MISC, USE   TO CHECK GLUCOSE TWICE DAILY AS DIRECTED, Disp: , Rfl:    aspirin EC 81 MG tablet, Take 81 mg by mouth daily., Disp: , Rfl:    brimonidine-timolol (COMBIGAN) 0.2-0.5 % ophthalmic solution, Place 1 drop into both eyes every 12 (twelve) hours., Disp: , Rfl:    dapagliflozin propanediol (FARXIGA) 10 MG TABS tablet, Take 10 mg by mouth every evening., Disp: , Rfl:    esomeprazole (NEXIUM) 40 MG capsule, Take 40 mg by mouth daily as needed. Acid reflux, Disp: , Rfl:    ezetimibe  (ZETIA ) 10 MG tablet, Take 1 tablet by mouth once daily, Disp: 90 tablet, Rfl: 3   glucose blood (ONETOUCH VERIO) test strip, USE 1 STRIP TO CHECK GLUCOSE TWICE DAILY, Disp: , Rfl:    glucose blood (ONETOUCH VERIO) test strip, USE 1 STRIP TO CHECK GLUCOSE TWICE DAILY AS DIRECTED, Disp: , Rfl:  irbesartan (AVAPRO) 150 MG tablet, Take 150 mg by mouth in the morning., Disp: , Rfl:    latanoprost (XALATAN) 0.005 % ophthalmic solution, 1 drop into each eye in the evening, Disp: , Rfl:    pregabalin (LYRICA) 50 MG capsule, Take 50 mg by mouth at bedtime., Disp: , Rfl:    rosuvastatin  (CRESTOR ) 40 MG tablet, Take 20 mg by mouth daily., Disp: , Rfl:    Semaglutide, 1 MG/DOSE, 2 MG/1.5ML SOPN, , Disp: , Rfl:    torsemide  (DEMADEX ) 10 MG tablet, Take 1 tablet (10 mg total) by mouth every other day., Disp: 90 tablet, Rfl: 1   Objective:     There were no vitals filed for this visit.    There is no height or weight on file to calculate BMI.    Physical Exam:    ***   Electronically signed by:  Odis Mace D.[name] 9:52 AM 08/22/23

## 2023-08-23 ENCOUNTER — Telehealth: Payer: Self-pay

## 2023-08-23 ENCOUNTER — Ambulatory Visit: Admitting: Sports Medicine

## 2023-08-23 VITALS — HR 81 | Ht 63.0 in | Wt 220.0 lb

## 2023-08-23 DIAGNOSIS — M25572 Pain in left ankle and joints of left foot: Secondary | ICD-10-CM

## 2023-08-23 DIAGNOSIS — M25551 Pain in right hip: Secondary | ICD-10-CM

## 2023-08-23 DIAGNOSIS — M7061 Trochanteric bursitis, right hip: Secondary | ICD-10-CM | POA: Diagnosis not present

## 2023-08-23 DIAGNOSIS — M1611 Unilateral primary osteoarthritis, right hip: Secondary | ICD-10-CM

## 2023-08-23 DIAGNOSIS — G8929 Other chronic pain: Secondary | ICD-10-CM | POA: Diagnosis not present

## 2023-08-23 NOTE — Patient Instructions (Signed)
 6 month follow up can come in sooner if needed

## 2023-08-23 NOTE — Telephone Encounter (Signed)
 LVM to schedule DM shoe fitting  Ppw exp 10/05/23

## 2023-08-24 ENCOUNTER — Ambulatory Visit: Admitting: Sports Medicine

## 2023-08-25 DIAGNOSIS — E559 Vitamin D deficiency, unspecified: Secondary | ICD-10-CM | POA: Diagnosis not present

## 2023-08-25 DIAGNOSIS — I10 Essential (primary) hypertension: Secondary | ICD-10-CM | POA: Diagnosis not present

## 2023-08-30 ENCOUNTER — Ambulatory Visit (INDEPENDENT_AMBULATORY_CARE_PROVIDER_SITE_OTHER)

## 2023-08-30 DIAGNOSIS — I700 Atherosclerosis of aorta: Secondary | ICD-10-CM | POA: Diagnosis not present

## 2023-08-30 DIAGNOSIS — E118 Type 2 diabetes mellitus with unspecified complications: Secondary | ICD-10-CM | POA: Diagnosis not present

## 2023-08-30 DIAGNOSIS — M2141 Flat foot [pes planus] (acquired), right foot: Secondary | ICD-10-CM

## 2023-08-30 DIAGNOSIS — K219 Gastro-esophageal reflux disease without esophagitis: Secondary | ICD-10-CM | POA: Diagnosis not present

## 2023-08-30 DIAGNOSIS — N261 Atrophy of kidney (terminal): Secondary | ICD-10-CM | POA: Diagnosis not present

## 2023-08-30 DIAGNOSIS — N1831 Chronic kidney disease, stage 3a: Secondary | ICD-10-CM | POA: Diagnosis not present

## 2023-08-30 DIAGNOSIS — E1142 Type 2 diabetes mellitus with diabetic polyneuropathy: Secondary | ICD-10-CM | POA: Diagnosis not present

## 2023-08-30 DIAGNOSIS — I2510 Atherosclerotic heart disease of native coronary artery without angina pectoris: Secondary | ICD-10-CM | POA: Diagnosis not present

## 2023-08-30 DIAGNOSIS — M2142 Flat foot [pes planus] (acquired), left foot: Secondary | ICD-10-CM | POA: Diagnosis not present

## 2023-08-30 DIAGNOSIS — Z0000 Encounter for general adult medical examination without abnormal findings: Secondary | ICD-10-CM | POA: Diagnosis not present

## 2023-08-30 DIAGNOSIS — E559 Vitamin D deficiency, unspecified: Secondary | ICD-10-CM | POA: Diagnosis not present

## 2023-08-30 DIAGNOSIS — E1121 Type 2 diabetes mellitus with diabetic nephropathy: Secondary | ICD-10-CM | POA: Diagnosis not present

## 2023-08-30 DIAGNOSIS — E782 Mixed hyperlipidemia: Secondary | ICD-10-CM | POA: Diagnosis not present

## 2023-08-30 DIAGNOSIS — I10 Essential (primary) hypertension: Secondary | ICD-10-CM | POA: Diagnosis not present

## 2023-08-30 NOTE — Progress Notes (Signed)

## 2023-08-30 NOTE — Telephone Encounter (Signed)
 error

## 2023-09-06 ENCOUNTER — Ambulatory Visit: Admitting: Podiatry

## 2023-09-06 ENCOUNTER — Encounter: Payer: Self-pay | Admitting: Podiatry

## 2023-09-06 DIAGNOSIS — B351 Tinea unguium: Secondary | ICD-10-CM | POA: Diagnosis not present

## 2023-09-06 DIAGNOSIS — M79674 Pain in right toe(s): Secondary | ICD-10-CM | POA: Diagnosis not present

## 2023-09-06 DIAGNOSIS — M79675 Pain in left toe(s): Secondary | ICD-10-CM | POA: Diagnosis not present

## 2023-09-06 DIAGNOSIS — E118 Type 2 diabetes mellitus with unspecified complications: Secondary | ICD-10-CM

## 2023-09-06 NOTE — Progress Notes (Signed)
 This patient returns to my office for at risk foot care.  This patient requires this care by a professional since this patient will be at risk due to having dabetes and CKD.   This patient is unable to cut nails herself since the patient cannot reach her nails.These nails are painful walking and wearing shoes.  This patient presents for at risk foot care today.  General Appearance  Alert, conversant and in no acute stress.  Vascular  Dorsalis pedis  pulses are palpable  bilaterally. Posterior tibial pulses are absent  bilaterally. Capillary return is within normal limits  bilaterally. Temperature is within normal limits  bilaterally.  Neurologic  Senn-Weinstein monofilament wire test within normal limits  bilaterally. Muscle power within normal limits bilaterally.  Nails Thick disfigured discolored nails with subungual debris  from hallux to fifth toes bilaterally. No evidence of bacterial infection or drainage bilaterally.  Orthopedic  No limitations of motion  feet .  No crepitus or effusions noted.  No bony pathology or digital deformities noted.  HAV  B/L.  Peroneal spastic flaftoot left.    Skin  normotropic skin with no porokeratosis noted bilaterally.  No signs of infections or ulcers noted.     Onychomycosis  Pain in right toes  Pain in left toes  Consent was obtained for treatment procedures.   Mechanical debridement of nails 1-5  bilaterally performed with a nail nipper.  Filed with dremel without incident.    Return office visit   10 weeks                 Told patient to return for periodic foot care and evaluation due to potential at risk complications.   Ruffin Cotton DPM

## 2023-09-13 DIAGNOSIS — I10 Essential (primary) hypertension: Secondary | ICD-10-CM | POA: Diagnosis not present

## 2023-09-13 DIAGNOSIS — E1122 Type 2 diabetes mellitus with diabetic chronic kidney disease: Secondary | ICD-10-CM | POA: Diagnosis not present

## 2023-09-13 DIAGNOSIS — H2101 Hyphema, right eye: Secondary | ICD-10-CM | POA: Diagnosis not present

## 2023-09-13 DIAGNOSIS — I9789 Other postprocedural complications and disorders of the circulatory system, not elsewhere classified: Secondary | ICD-10-CM | POA: Diagnosis not present

## 2023-09-13 DIAGNOSIS — R29818 Other symptoms and signs involving the nervous system: Secondary | ICD-10-CM | POA: Diagnosis not present

## 2023-09-13 DIAGNOSIS — I129 Hypertensive chronic kidney disease with stage 1 through stage 4 chronic kidney disease, or unspecified chronic kidney disease: Secondary | ICD-10-CM | POA: Diagnosis not present

## 2023-09-13 DIAGNOSIS — N183 Chronic kidney disease, stage 3 unspecified: Secondary | ICD-10-CM | POA: Diagnosis not present

## 2023-10-09 DIAGNOSIS — N1832 Chronic kidney disease, stage 3b: Secondary | ICD-10-CM | POA: Diagnosis not present

## 2023-10-16 DIAGNOSIS — I129 Hypertensive chronic kidney disease with stage 1 through stage 4 chronic kidney disease, or unspecified chronic kidney disease: Secondary | ICD-10-CM | POA: Diagnosis not present

## 2023-10-16 DIAGNOSIS — E1122 Type 2 diabetes mellitus with diabetic chronic kidney disease: Secondary | ICD-10-CM | POA: Diagnosis not present

## 2023-10-16 DIAGNOSIS — I701 Atherosclerosis of renal artery: Secondary | ICD-10-CM | POA: Diagnosis not present

## 2023-10-16 DIAGNOSIS — N1832 Chronic kidney disease, stage 3b: Secondary | ICD-10-CM | POA: Diagnosis not present

## 2023-10-16 DIAGNOSIS — E785 Hyperlipidemia, unspecified: Secondary | ICD-10-CM | POA: Diagnosis not present

## 2023-10-19 DIAGNOSIS — E559 Vitamin D deficiency, unspecified: Secondary | ICD-10-CM | POA: Diagnosis not present

## 2023-11-14 DIAGNOSIS — E782 Mixed hyperlipidemia: Secondary | ICD-10-CM | POA: Diagnosis not present

## 2023-11-14 DIAGNOSIS — N1832 Chronic kidney disease, stage 3b: Secondary | ICD-10-CM | POA: Diagnosis not present

## 2023-11-14 DIAGNOSIS — E559 Vitamin D deficiency, unspecified: Secondary | ICD-10-CM | POA: Diagnosis not present

## 2023-11-14 DIAGNOSIS — I10 Essential (primary) hypertension: Secondary | ICD-10-CM | POA: Diagnosis not present

## 2023-11-14 DIAGNOSIS — E1121 Type 2 diabetes mellitus with diabetic nephropathy: Secondary | ICD-10-CM | POA: Diagnosis not present

## 2023-12-07 ENCOUNTER — Encounter: Payer: Self-pay | Admitting: Podiatry

## 2023-12-07 ENCOUNTER — Ambulatory Visit (INDEPENDENT_AMBULATORY_CARE_PROVIDER_SITE_OTHER): Admitting: Podiatry

## 2023-12-07 DIAGNOSIS — B351 Tinea unguium: Secondary | ICD-10-CM

## 2023-12-07 DIAGNOSIS — E118 Type 2 diabetes mellitus with unspecified complications: Secondary | ICD-10-CM | POA: Diagnosis not present

## 2023-12-07 DIAGNOSIS — M79674 Pain in right toe(s): Secondary | ICD-10-CM | POA: Diagnosis not present

## 2023-12-07 DIAGNOSIS — M79675 Pain in left toe(s): Secondary | ICD-10-CM | POA: Diagnosis not present

## 2023-12-07 NOTE — Progress Notes (Signed)
 This patient returns to my office for at risk foot care.  This patient requires this care by a professional since this patient will be at risk due to having dabetes and CKD.   This patient is unable to cut nails herself since the patient cannot reach her nails.These nails are painful walking and wearing shoes.  This patient presents for at risk foot care today.  General Appearance  Alert, conversant and in no acute stress.  Vascular  Dorsalis pedis  pulses are palpable  bilaterally. Posterior tibial pulses are absent  bilaterally. Capillary return is within normal limits  bilaterally. Temperature is within normal limits  bilaterally.  Neurologic  Senn-Weinstein monofilament wire test within normal limits  bilaterally. Muscle power within normal limits bilaterally.  Nails Thick disfigured discolored nails with subungual debris  from hallux to fifth toes bilaterally. No evidence of bacterial infection or drainage bilaterally.  Orthopedic  No limitations of motion  feet .  No crepitus or effusions noted.  No bony pathology or digital deformities noted.  HAV  B/L.  Peroneal spastic flaftoot left.    Skin  normotropic skin with no porokeratosis noted bilaterally.  No signs of infections or ulcers noted.     Onychomycosis  Pain in right toes  Pain in left toes  Consent was obtained for treatment procedures.   Mechanical debridement of nails 1-5  bilaterally performed with a nail nipper.  Filed with dremel without incident.    Return office visit   10 weeks                 Told patient to return for periodic foot care and evaluation due to potential at risk complications.   Ruffin Cotton DPM

## 2024-01-09 ENCOUNTER — Emergency Department (HOSPITAL_BASED_OUTPATIENT_CLINIC_OR_DEPARTMENT_OTHER)
Admission: EM | Admit: 2024-01-09 | Discharge: 2024-01-10 | Disposition: A | Attending: Emergency Medicine | Admitting: Emergency Medicine

## 2024-01-09 ENCOUNTER — Encounter (HOSPITAL_BASED_OUTPATIENT_CLINIC_OR_DEPARTMENT_OTHER): Payer: Self-pay

## 2024-01-09 LAB — CBC WITH DIFFERENTIAL/PLATELET
Abs Immature Granulocytes: 0.02 K/uL (ref 0.00–0.07)
Basophils Absolute: 0 K/uL (ref 0.0–0.1)
Basophils Relative: 1 %
Eosinophils Absolute: 0.4 K/uL (ref 0.0–0.5)
Eosinophils Relative: 6 %
HCT: 43.5 % (ref 36.0–46.0)
Hemoglobin: 14.4 g/dL (ref 12.0–15.0)
Immature Granulocytes: 0 %
Lymphocytes Relative: 40 %
Lymphs Abs: 2.3 K/uL (ref 0.7–4.0)
MCH: 29.6 pg (ref 26.0–34.0)
MCHC: 33.1 g/dL (ref 30.0–36.0)
MCV: 89.3 fL (ref 80.0–100.0)
Monocytes Absolute: 0.9 K/uL (ref 0.1–1.0)
Monocytes Relative: 15 %
Neutro Abs: 2.2 K/uL (ref 1.7–7.7)
Neutrophils Relative %: 38 %
Platelets: 169 K/uL (ref 150–400)
RBC: 4.87 MIL/uL (ref 3.87–5.11)
RDW: 13.9 % (ref 11.5–15.5)
WBC: 5.9 K/uL (ref 4.0–10.5)
nRBC: 0 % (ref 0.0–0.2)

## 2024-01-09 MED ORDER — IRBESARTAN 150 MG PO TABS
150.0000 mg | ORAL_TABLET | Freq: Once | ORAL | Status: AC
  Administered 2024-01-09: 150 mg via ORAL
  Filled 2024-01-09: qty 1

## 2024-01-09 NOTE — ED Triage Notes (Signed)
 Pt states her BP has been running high last 2 days Takes her meds as prescribed At home 203/126 PTA  Did not take her PM dose  C/O HA PTA No other symptoms

## 2024-01-09 NOTE — ED Provider Notes (Signed)
 [name]  Provider Note  CSN: [id number] Arrival date & time: 01/09/24 2222  History Chief Complaint  Patient presents with   Hypertension    [name] is a 78 y.o. female with history of HTN, DM, HLD here with increased BP through the day today. She was having some headache earlier which prompted her to check her BP and it was 150s, she checked it several times over the course of the day and it was steadily increasing to the 200s. She had called her doctor who recommended she come to an outpatient visit later this week but with the degree of elevation she became concerned and came to the ED for evaluation. She has not had her evening ARB dose yet.    Home Medications Prior to Admission medications   Medication Sig Start Date End Date Taking? Authorizing Provider  acetaminophen  (TYLENOL  8 HOUR) 650 MG CR tablet 1 tablet    [provider]  AgaMatrix Ultra-Thin Lancets MISC USE   TO CHECK GLUCOSE TWICE DAILY AS DIRECTED    [provider]  aspirin EC 81 MG tablet Take 81 mg by mouth daily.    [provider]  brimonidine-timolol (COMBIGAN) 0.2-0.5 % ophthalmic solution Place 1 drop into both eyes every 12 (twelve) hours.    [provider]  dapagliflozin propanediol (FARXIGA) 10 MG TABS tablet Take 10 mg by mouth every evening. 01/14/20   [provider]  esomeprazole (NEXIUM) 40 MG capsule Take 40 mg by mouth daily as needed. Acid reflux    [provider]  ezetimibe  (ZETIA ) 10 MG tablet Take 1 tablet by mouth once daily 08/16/23   Tolia, Sunit, DO  glucose blood (ONETOUCH VERIO) test strip USE 1 STRIP TO CHECK GLUCOSE TWICE DAILY    [provider]  glucose blood (ONETOUCH VERIO) test strip USE 1 STRIP TO CHECK GLUCOSE TWICE DAILY AS DIRECTED 02/14/21   [provider]  irbesartan  (AVAPRO ) 150 MG tablet Take 150 mg by mouth in the morning. 06/15/17   [provider]  latanoprost (XALATAN) 0.005 % ophthalmic solution 1 drop into each eye in the evening 04/21/21   [provider]  pregabalin (LYRICA) 50 MG capsule Take 50 mg by mouth at bedtime. 01/30/20   [provider]  rosuvastatin  (CRESTOR ) 40 MG tablet Take 20 mg by mouth daily.    [provider]  Semaglutide, 1 MG/DOSE, 2 MG/1.5ML SOPN     [provider]  torsemide  (DEMADEX ) 10 MG tablet Take 1 tablet (10 mg total) by mouth every other day. 04/12/23   Tolia, Sunit, DO     Allergies    Methazolamide, Sulfa antibiotics, Hydrocodone, Hydrocodone-acetaminophen , Sulfonamide derivatives, and Latex   Review of Systems   Review of Systems Please see HPI for pertinent positives and negatives  Physical Exam BP (!) 208/86   Pulse 76   Temp 97.7 F (36.5 C) (Oral)   Resp 18   Ht 5' 3 (1.6 m)   Wt 99.8 kg   SpO2 100%   BMI 38.97 kg/m   Physical Exam Vitals and nursing note reviewed.  Constitutional:      Appearance: Normal appearance.  HENT:     Head: Normocephalic and atraumatic.     Nose: Nose normal.     Mouth/Throat:     Mouth: Mucous membranes are moist.  Eyes:     Extraocular Movements: Extraocular movements intact.     Conjunctiva/sclera: Conjunctivae  normal.  Cardiovascular:     Rate and Rhythm: Normal rate.  Pulmonary:     Effort: Pulmonary effort is normal.     Breath sounds: Normal breath sounds.  Abdominal:     General: Abdomen is flat.     Palpations: Abdomen is soft.     Tenderness: There is no abdominal tenderness.  Musculoskeletal:        General: No swelling. Normal range of motion.     Cervical back: Neck supple.  Skin:    General: Skin is warm and dry.  Neurological:     General: No focal deficit present.     Mental Status: She is alert.  Psychiatric:        Mood and Affect: Mood normal.     ED Results / Procedures / Treatments   EKG None  Procedures Procedures  Medications Ordered in the  ED Medications - No data to display  Initial Impression and Plan  Patient here with elevated BP, had some headache earlier but no other signs of acute organ damage. Will check labs, EKG and monitor in the ED. Evening dose of irbesartan  ordered.   ED Course       MDM Rules/Calculators/A&P Medical Decision Making Amount and/or Complexity of Data Reviewed Labs: ordered.     Final Clinical Impression(s) / ED Diagnoses Final diagnoses:  None    Rx / DC Orders ED Discharge Orders     None

## 2024-01-10 LAB — BASIC METABOLIC PANEL WITH GFR
Anion gap: 9 (ref 5–15)
BUN: 21 mg/dL (ref 8–23)
CO2: 26 mmol/L (ref 22–32)
Calcium: 10.2 mg/dL (ref 8.9–10.3)
Chloride: 102 mmol/L (ref 98–111)
Creatinine, Ser: 1.5 mg/dL — ABNORMAL HIGH (ref 0.44–1.00)
GFR, Estimated: 36 mL/min — ABNORMAL LOW (ref >60)
Glucose, Bld: 93 mg/dL (ref 70–99)
Potassium: 3.9 mmol/L (ref 3.5–5.1)
Sodium: 137 mmol/L (ref 135–145)

## 2024-01-16 ENCOUNTER — Ambulatory Visit: Admitting: Sports Medicine

## 2024-02-15 ENCOUNTER — Ambulatory Visit: Admitting: Podiatry

## 2024-02-15 ENCOUNTER — Encounter: Payer: Self-pay | Admitting: Podiatry

## 2024-02-15 DIAGNOSIS — E118 Type 2 diabetes mellitus with unspecified complications: Secondary | ICD-10-CM

## 2024-02-15 DIAGNOSIS — M79675 Pain in left toe(s): Secondary | ICD-10-CM | POA: Diagnosis not present

## 2024-02-15 DIAGNOSIS — B351 Tinea unguium: Secondary | ICD-10-CM

## 2024-02-15 DIAGNOSIS — M2141 Flat foot [pes planus] (acquired), right foot: Secondary | ICD-10-CM

## 2024-02-15 DIAGNOSIS — M79674 Pain in right toe(s): Secondary | ICD-10-CM | POA: Diagnosis not present

## 2024-02-15 DIAGNOSIS — M2142 Flat foot [pes planus] (acquired), left foot: Secondary | ICD-10-CM | POA: Diagnosis not present

## 2024-02-15 NOTE — Progress Notes (Signed)
 This patient returns to my office for at risk foot care.  This patient requires this care by a professional since this patient will be at risk due to having dabetes and CKD.   This patient is unable to cut nails herself since the patient cannot reach her nails.These nails are painful walking and wearing shoes.  This patient presents for at risk foot care today.  General Appearance  Alert, conversant and in no acute stress.  Vascular  Dorsalis pedis  pulses are palpable  bilaterally. Posterior tibial pulses are absent  bilaterally. Capillary return is within normal limits  bilaterally. Temperature is within normal limits  bilaterally.  Neurologic  Senn-Weinstein monofilament wire test within normal limits  bilaterally. Muscle power within normal limits bilaterally.  Nails Thick disfigured discolored nails with subungual debris  from hallux to fifth toes bilaterally. No evidence of bacterial infection or drainage bilaterally.  Orthopedic  No limitations of motion  feet .  No crepitus or effusions noted.  No bony pathology or digital deformities noted.  HAV  B/L.  Peroneal spastic flaftoot left.    Skin  normotropic skin with no porokeratosis noted bilaterally.  No signs of infections or ulcers noted.     Onychomycosis  Pain in right toes  Pain in left toes  Consent was obtained for treatment procedures.   Mechanical debridement of nails 1-5  bilaterally performed with a nail nipper.  Filed with dremel without incident.    Return office visit   12  weeks                 Told patient to return for periodic foot care and evaluation due to potential at risk complications.   Cordella Bold DPM

## 2024-03-13 ENCOUNTER — Ambulatory Visit: Admitting: Sports Medicine

## 2024-05-15 ENCOUNTER — Ambulatory Visit: Admitting: Podiatry
